# Patient Record
Sex: Female | Born: 1935 | Race: White | Hispanic: No | Smoking: Never smoker
Health system: Southern US, Community
[De-identification: ages and names within clinical notes are randomized; demographics above are authoritative.]

## PROBLEM LIST (undated history)

## (undated) DIAGNOSIS — I10 Essential (primary) hypertension: Secondary | ICD-10-CM

## (undated) DIAGNOSIS — D490 Neoplasm of unspecified behavior of digestive system: Secondary | ICD-10-CM

## (undated) DIAGNOSIS — J449 Chronic obstructive pulmonary disease, unspecified: Secondary | ICD-10-CM

## (undated) DIAGNOSIS — I509 Heart failure, unspecified: Secondary | ICD-10-CM

## (undated) DIAGNOSIS — E7800 Pure hypercholesterolemia, unspecified: Secondary | ICD-10-CM

## (undated) DIAGNOSIS — F0390 Unspecified dementia without behavioral disturbance: Secondary | ICD-10-CM

## (undated) DIAGNOSIS — I219 Acute myocardial infarction, unspecified: Secondary | ICD-10-CM

## (undated) DIAGNOSIS — G459 Transient cerebral ischemic attack, unspecified: Secondary | ICD-10-CM

## (undated) DIAGNOSIS — I5020 Unspecified systolic (congestive) heart failure: Secondary | ICD-10-CM

## (undated) HISTORY — PX: JOINT REPLACEMENT: SHX530

## (undated) HISTORY — PX: CARDIAC CATHETERIZATION: SHX172

## (undated) HISTORY — PX: APPENDECTOMY: SHX54

## (undated) HISTORY — PX: TOTAL KNEE ARTHROPLASTY: SHX125

## (undated) HISTORY — PX: CHOLECYSTECTOMY: SHX55

## (undated) HISTORY — PX: OTHER SURGICAL HISTORY: SHX169

---

## 2015-04-03 ENCOUNTER — Encounter (HOSPITAL_COMMUNITY): Payer: Self-pay | Admitting: *Deleted

## 2015-04-03 ENCOUNTER — Telehealth: Payer: Self-pay | Admitting: Family Medicine

## 2015-04-03 ENCOUNTER — Inpatient Hospital Stay (HOSPITAL_COMMUNITY)
Admission: AD | Admit: 2015-04-03 | Discharge: 2015-04-07 | DRG: 374 | Disposition: A | Discharge status: Hospice | Payer: Medicare Other | Source: Other Acute Inpatient Hospital | Attending: Internal Medicine | Admitting: Internal Medicine

## 2015-04-03 DIAGNOSIS — Z8673 Personal history of transient ischemic attack (TIA), and cerebral infarction without residual deficits: Secondary | ICD-10-CM

## 2015-04-03 DIAGNOSIS — E7800 Pure hypercholesterolemia, unspecified: Secondary | ICD-10-CM | POA: Diagnosis present

## 2015-04-03 DIAGNOSIS — G629 Polyneuropathy, unspecified: Secondary | ICD-10-CM | POA: Diagnosis present

## 2015-04-03 DIAGNOSIS — D62 Acute posthemorrhagic anemia: Secondary | ICD-10-CM | POA: Diagnosis present

## 2015-04-03 DIAGNOSIS — Z800 Family history of malignant neoplasm of digestive organs: Secondary | ICD-10-CM

## 2015-04-03 DIAGNOSIS — G8929 Other chronic pain: Secondary | ICD-10-CM | POA: Diagnosis present

## 2015-04-03 DIAGNOSIS — C787 Secondary malignant neoplasm of liver and intrahepatic bile duct: Secondary | ICD-10-CM | POA: Diagnosis present

## 2015-04-03 DIAGNOSIS — R7989 Other specified abnormal findings of blood chemistry: Secondary | ICD-10-CM | POA: Insufficient documentation

## 2015-04-03 DIAGNOSIS — D500 Iron deficiency anemia secondary to blood loss (chronic): Secondary | ICD-10-CM | POA: Diagnosis present

## 2015-04-03 DIAGNOSIS — C786 Secondary malignant neoplasm of retroperitoneum and peritoneum: Secondary | ICD-10-CM | POA: Diagnosis present

## 2015-04-03 DIAGNOSIS — I2510 Atherosclerotic heart disease of native coronary artery without angina pectoris: Secondary | ICD-10-CM | POA: Diagnosis present

## 2015-04-03 DIAGNOSIS — I482 Chronic atrial fibrillation: Secondary | ICD-10-CM | POA: Diagnosis present

## 2015-04-03 DIAGNOSIS — K922 Gastrointestinal hemorrhage, unspecified: Secondary | ICD-10-CM | POA: Diagnosis not present

## 2015-04-03 DIAGNOSIS — C799 Secondary malignant neoplasm of unspecified site: Secondary | ICD-10-CM | POA: Insufficient documentation

## 2015-04-03 DIAGNOSIS — J449 Chronic obstructive pulmonary disease, unspecified: Secondary | ICD-10-CM | POA: Diagnosis present

## 2015-04-03 DIAGNOSIS — J440 Chronic obstructive pulmonary disease with acute lower respiratory infection: Secondary | ICD-10-CM | POA: Diagnosis present

## 2015-04-03 DIAGNOSIS — J189 Pneumonia, unspecified organism: Secondary | ICD-10-CM | POA: Diagnosis present

## 2015-04-03 DIAGNOSIS — I509 Heart failure, unspecified: Secondary | ICD-10-CM

## 2015-04-03 DIAGNOSIS — K259 Gastric ulcer, unspecified as acute or chronic, without hemorrhage or perforation: Secondary | ICD-10-CM | POA: Diagnosis present

## 2015-04-03 DIAGNOSIS — Z66 Do not resuscitate: Secondary | ICD-10-CM | POA: Diagnosis present

## 2015-04-03 DIAGNOSIS — I5022 Chronic systolic (congestive) heart failure: Secondary | ICD-10-CM | POA: Insufficient documentation

## 2015-04-03 DIAGNOSIS — F0390 Unspecified dementia without behavioral disturbance: Secondary | ICD-10-CM | POA: Diagnosis present

## 2015-04-03 DIAGNOSIS — K921 Melena: Secondary | ICD-10-CM | POA: Diagnosis present

## 2015-04-03 DIAGNOSIS — C169 Malignant neoplasm of stomach, unspecified: Secondary | ICD-10-CM | POA: Diagnosis present

## 2015-04-03 DIAGNOSIS — I5020 Unspecified systolic (congestive) heart failure: Secondary | ICD-10-CM

## 2015-04-03 DIAGNOSIS — I4891 Unspecified atrial fibrillation: Secondary | ICD-10-CM | POA: Diagnosis not present

## 2015-04-03 DIAGNOSIS — D490 Neoplasm of unspecified behavior of digestive system: Secondary | ICD-10-CM | POA: Insufficient documentation

## 2015-04-03 DIAGNOSIS — N184 Chronic kidney disease, stage 4 (severe): Secondary | ICD-10-CM | POA: Diagnosis present

## 2015-04-03 DIAGNOSIS — E785 Hyperlipidemia, unspecified: Secondary | ICD-10-CM | POA: Diagnosis present

## 2015-04-03 DIAGNOSIS — Z515 Encounter for palliative care: Secondary | ICD-10-CM | POA: Diagnosis present

## 2015-04-03 DIAGNOSIS — R945 Abnormal results of liver function studies: Secondary | ICD-10-CM

## 2015-04-03 DIAGNOSIS — I130 Hypertensive heart and chronic kidney disease with heart failure and stage 1 through stage 4 chronic kidney disease, or unspecified chronic kidney disease: Secondary | ICD-10-CM | POA: Diagnosis present

## 2015-04-03 DIAGNOSIS — I252 Old myocardial infarction: Secondary | ICD-10-CM | POA: Diagnosis not present

## 2015-04-03 DIAGNOSIS — C801 Malignant (primary) neoplasm, unspecified: Secondary | ICD-10-CM

## 2015-04-03 DIAGNOSIS — D371 Neoplasm of uncertain behavior of stomach: Secondary | ICD-10-CM | POA: Diagnosis not present

## 2015-04-03 DIAGNOSIS — K3189 Other diseases of stomach and duodenum: Secondary | ICD-10-CM

## 2015-04-03 HISTORY — DX: Pure hypercholesterolemia, unspecified: E78.00

## 2015-04-03 HISTORY — DX: Essential (primary) hypertension: I10

## 2015-04-03 HISTORY — DX: Chronic obstructive pulmonary disease, unspecified: J44.9

## 2015-04-03 HISTORY — DX: Transient cerebral ischemic attack, unspecified: G45.9

## 2015-04-03 HISTORY — DX: Heart failure, unspecified: I50.9

## 2015-04-03 HISTORY — DX: Unspecified systolic (congestive) heart failure: I50.20

## 2015-04-03 HISTORY — DX: Unspecified dementia, unspecified severity, without behavioral disturbance, psychotic disturbance, mood disturbance, and anxiety: F03.90

## 2015-04-03 HISTORY — DX: Acute myocardial infarction, unspecified: I21.9

## 2015-04-03 HISTORY — DX: Neoplasm of unspecified behavior of digestive system: D49.0

## 2015-04-03 MED ORDER — OXYCODONE HCL 5 MG PO TABS: 5.0000 mg | ORAL_TABLET | ORAL | Status: DC | PRN

## 2015-04-03 MED ORDER — ONDANSETRON HCL 4 MG PO TABS: 4.0000 mg | ORAL_TABLET | Freq: Four times a day (QID) | ORAL | Status: DC | PRN

## 2015-04-03 MED ORDER — PANTOPRAZOLE SODIUM 40 MG IV SOLR
INTRAVENOUS | Status: AC
  Filled 2015-04-03: qty 80

## 2015-04-03 MED ORDER — HYDROMORPHONE HCL 1 MG/ML IJ SOLN: 0.5000 mg | INTRAMUSCULAR | Status: DC | PRN

## 2015-04-03 MED ORDER — SODIUM CHLORIDE 0.9 % IV SOLN
8.0000 mg/h | INTRAVENOUS | Status: DC
  Administered 2015-04-04: 8 mg/h via INTRAVENOUS
  Filled 2015-04-03 (×4): qty 80

## 2015-04-03 MED ORDER — SODIUM CHLORIDE 0.9 % IV SOLN
Freq: Once | INTRAVENOUS | Status: AC
  Administered 2015-04-04: 06:00:00 via INTRAVENOUS

## 2015-04-03 MED ORDER — ONDANSETRON HCL 4 MG/2ML IJ SOLN: 4.0000 mg | Freq: Four times a day (QID) | INTRAMUSCULAR | Status: DC | PRN

## 2015-04-03 MED ORDER — PANTOPRAZOLE SODIUM 40 MG IV SOLR: 40.0000 mg | Freq: Two times a day (BID) | INTRAVENOUS | Status: DC

## 2015-04-03 MED ORDER — ACETAMINOPHEN 650 MG RE SUPP: 650.0000 mg | Freq: Four times a day (QID) | RECTAL | Status: DC | PRN

## 2015-04-03 MED ORDER — ACETAMINOPHEN 325 MG PO TABS
650.0000 mg | ORAL_TABLET | Freq: Four times a day (QID) | ORAL | Status: DC | PRN
  Administered 2015-04-05 – 2015-04-07 (×6): 650 mg via ORAL
  Filled 2015-04-03 (×6): qty 2

## 2015-04-03 MED ORDER — SODIUM CHLORIDE 0.9 % IV SOLN
INTRAVENOUS | Status: DC
  Administered 2015-04-03: 1000 mL via INTRAVENOUS
  Administered 2015-04-04 – 2015-04-06 (×4): via INTRAVENOUS

## 2015-04-03 NOTE — H&P (Addendum)
[facility] Admission History and Physical       Mercedes Garcia [id number] DOB: 08-27-1926 DOA: 04/03/2015  Referring physician: EDP PCP: Hubbard Robinson, MD  Specialists:   Chief Complaint: Anemia and Dark Stools  HPI: Mercedes Garcia is a 81 y.o. female with a history of CAD, CHF, COPD, TIAs, Hyperlipidemia, and Dementia who was transferred from [facility] ED to [facility] after being seen for complaints of weakness, malaise, and poor intake of foods and liquids for the past 2-3 days, and passing dark stools for 1 month.   Her family noticed the dark stools but she was on an iron supplement.   She was evaluated at [facility] ED and found to have a hemoglobin of 6.8 and an FOBT was HEME positive.  She was also found to have  A RLL Pneumonia and was placed on IV Antibiotics ( Levaquin).  Arrangements were made for transfer to [facility] since GI is not available there.    She was accepted to a Stepdown Unit Bed, and received a transfusion of 1 unit of packed RBCs while on route.  Her granddaughter is at the bedisd and reports that she had not had any nausea or vomiting or hematemesis.     Review of Systems: Unable to Obtain from the Patient  Constitutional: No Weight Loss, No Weight Gain, Night Sweats, Fevers, Chills, Dizziness, Light Headedness, Fatigue, or Generalized Weakness HEENT: No Headaches, Difficulty Swallowing,Tooth/Dental Problems,Sore Throat,  No Sneezing, Rhinitis, Ear Ache, Nasal Congestion, or Post Nasal Drip,  Cardio-vascular:  No Chest pain, Orthopnea, PND, Edema in Lower Extremities, Anasarca, Dizziness, Palpitations  Resp: No Dyspnea, No DOE, No Productive Cough, No Non-Productive Cough, No Hemoptysis, No Wheezing.    GI: No Heartburn, Indigestion, Abdominal Pain, Nausea, Vomiting, Diarrhea, Constipation, Hematemesis, Hematochezia, Melena, Change in Bowel Habits,  Loss of Appetite  GU: No Dysuria, No Change in Color of  Urine, No Urgency or Urinary Frequency, No Flank pain.  Musculoskeletal: No Joint Pain or Swelling, No Decreased Range of Motion, No Back Pain.  Neurologic: No Syncope, No Seizures, Muscle Weakness, Paresthesia, Vision Disturbance or Loss, No Diplopia, No Vertigo, No Difficulty Walking,  Skin: No Rash or Lesions. Psych: No Change in Mood or Affect, No Depression or Anxiety, No Memory loss, No Confusion, or Hallucinations   Past Medical History  Diagnosis Date  . Myocardial infarction ([place])   . CHF (congestive heart failure) ([place])   . COPD (chronic obstructive pulmonary disease) [facility])      Past Surgical History  Procedure Laterality Date  . Cardiac catheterization    . Joint replacement    . Cholecystectomy    . Total knee arthroplasty        Prior to Admission medications   Medication Sig Start Date End Date Taking? Authorizing Provider  aspirin EC 81 MG tablet Take 81 mg by mouth daily.   Yes Historical Provider, MD  atorvastatin (LIPITOR) 80 MG tablet Take 80 mg by mouth at bedtime.   Yes Historical Provider, MD  B Complex-Biotin-FA (B-COMPLEX PO) Take 1 tablet by mouth daily.   Yes Historical Provider, MD  clopidogrel (PLAVIX) 75 MG tablet Take 75 mg by mouth daily.   Yes Historical Provider, MD  fentaNYL (DURAGESIC - DOSED MCG/HR) 12 MCG/HR APP 1 PATCH TO THE SKIN Q 72 HOURS 03/23/15   Historical Provider, MD  ferrous sulfate 325 (65 FE) MG tablet Take 325 mg by mouth daily.   Yes Historical Provider, MD  furosemide (LASIX)  40 MG tablet Take 40 mg by mouth See admin instructions. Alternates taking 40mg  for two days then takes 60mg  on the third day, then continue   Yes Historical Provider, MD  gabapentin (NEURONTIN) 300 MG capsule Take 300 mg by mouth 3 (three) times daily.   Yes Historical Provider, MD  guaiFENesin (MUCINEX) 600 MG 12 hr tablet Take 600 mg by mouth every 12 (twelve) hours.   Yes Historical Provider, MD  lidocaine (XYLOCAINE) 5 % ointment Apply 1 application  topically daily as needed (for leg pain-neuropathy).  03/15/15  Yes Historical Provider, MD  lisinopril (PRINIVIL,ZESTRIL) 20 MG tablet Take 20 mg by mouth 2 (two) times daily.   Yes Historical Provider, MD  metoprolol tartrate (LOPRESSOR) 25 MG tablet Take 25 mg by mouth 3 (three) times daily.   Yes Historical Provider, MD  nitroGLYCERIN (NITROSTAT) 0.4 MG SL tablet Place 0.4 mg under the tongue every 5 (five) minutes as needed for chest pain.   Yes Historical Provider, MD  potassium chloride (K-DUR,KLOR-CON) 10 MEQ tablet Alternates taking 10MEQ for two days, then takes 15MEQ on the third day, and continue 12/29/14  Yes Historical Provider, MD     Allergies  Allergen Reactions  . Motrin [Ibuprofen] Nausea And Vomiting    Social History:  reports that she has never smoked. She does not have any smokeless tobacco history on file. She reports that she does not drink alcohol or use illicit drugs.     History reviewed. No pertinent family history.     Physical Exam:  GEN:  Pleasant  81 y.o. female examined and in no acute distress; cooperative with exam Filed Vitals:   04/03/15 2120 04/03/15 2145 04/03/15 2200 04/03/15 2203  BP:  95/56 107/56 107/56  Pulse:  101 102 107  Temp: 98.2 F (36.8 C)   98.2 F (36.8 C)  TempSrc: Oral     Resp:  12 19   Height: 5\' 5"  (1.651 m)   5\' 5"  (1.651 m)  Weight: 61.5 kg (135 lb 9.3 oz)   61.5 kg (135 lb 9.3 oz)  SpO2:  98% 99% 98%   Blood pressure 107/56, pulse 107, temperature 98.2 F (36.8 C), temperature source Oral, resp. rate 19, height 5\' 5"  (1.651 m), weight 61.5 kg (135 lb 9.3 oz), SpO2 98 %. PSYCH: She is alert and oriented x 1; does not appear anxious does not appear depressed; affect is normal HEENT: Normocephalic and Atraumatic, Mucous membranes pink; PERRLA; EOM intact; Fundi:  Benign;  No scleral icterus, Nares: Patent, Oropharynx: Clear, Edentulous,    Neck:  FROM, No Cervical Lymphadenopathy nor Thyromegaly or Carotid Bruit; No  JVD; Breasts:: Not examined CHEST WALL: No tenderness CHEST: Normal respiration, clear to auscultation bilaterally HEART: Regular rate and rhythm; no murmurs rubs or gallops BACK: No kyphosis or scoliosis; No CVA tenderness ABDOMEN: Positive Bowel Sounds, Obese, Soft Non-Tender, No Rebound or Guarding; No Masses, No Organomegaly, No Pannus; No Intertriginous candida. Rectal Exam: Not done EXTREMITIES: No Cyanosis, Clubbing, or Edema; No Ulcerations. Genitalia: not examined PULSES: 2+ and symmetric SKIN: Normal hydration no rash or ulceration CNS:  Alert and Oriented x 1, No Focal Deficits Vascular: pulses palpable throughout    Labs on Admission:  Basic Metabolic Panel: No results for input(s): NA, K, CL, CO2, GLUCOSE, BUN, CREATININE, CALCIUM, MG, PHOS in the last 168 hours. Liver Function Tests: No results for input(s): AST, ALT, ALKPHOS, BILITOT, PROT, ALBUMIN in the last 168 hours. No results for input(s): LIPASE, AMYLASE in  the last 168 hours. No results for input(s): AMMONIA in the last 168 hours. CBC: No results for input(s): WBC, NEUTROABS, HGB, HCT, MCV, PLT in the last 168 hours. Cardiac Enzymes: No results for input(s): CKTOTAL, CKMB, CKMBINDEX, TROPONINI in the last 168 hours.  BNP (last 3 results) No results for input(s): BNP in the last 8760 hours.  ProBNP (last 3 results) No results for input(s): PROBNP in the last 8760 hours.  CBG: No results for input(s): GLUCAP in the last 168 hours.  Radiological Exams on Admission: No results found.    Assessment/Plan:   81 y.o. female with  Principal Problem:   1.     Anemia due to chronic blood loss   Transfuse 3 units PRCS   Monitor H/Hs      Active Problems:   2.    GI bleed   GI consult in AM   IV Protonix Drip   IVFs   Hold Plavix and ASA Rx     3.    CAP (community acquired pneumonia)   IV Abxs ( Levaquin) given for CAP   Albuterol Nebs     4.    CHF (congestive heart failure) (HCC)   Monitor  I/Os   Lasix      5.    CAD (coronary artery disease)     6.    COPD (chronic obstructive pulmonary disease) ([place])   Duonebs     7.    Dementia   stable       8.    DVT Prophylaxis   SCDs   Code Status:     FULL CODE        Family Communication:   Family at Bedside  Disposition Plan:    Inpatient Status        Time spent:  [facility] Pager [phone]   If [place] Please Contact the Day Rounding Team MD for [facility]  If 7PM-7AM, Please Contact Night-Floor Coverage  [email or web address] Password [email or web address] 04/03/2015, 11:18 PM     ADDENDUM:   Patient was seen and examined on 04/03/2015

## 2015-04-03 NOTE — Telephone Encounter (Signed)
Saby, Sima 2027/02/28  Transfer from [facility] ED Dr. Hardie Pulley [phone] Reason: no GI coverage at [facility] PMH COPD, CHF, dementia on ASA but no anticoagulation presented with URI. CXR showed RLL pneumonia. Further hx of dark stools. No active bleeding.  Afebrile, 96% on 2L, 110/60, HR 100  WBC 22 Hgb 6.7; MCV 73 BUN 43  Creatinine 2 (baseline 1 on 02/08/2015)  Has been typed and crossed for 2 units and started on abx  Accepted to SDU.   Murray Hodgkins, MD

## 2015-04-04 ENCOUNTER — Encounter (HOSPITAL_COMMUNITY): Payer: Self-pay | Admitting: Gastroenterology

## 2015-04-04 ENCOUNTER — Inpatient Hospital Stay (HOSPITAL_COMMUNITY): Payer: Medicare Other

## 2015-04-04 ENCOUNTER — Encounter (HOSPITAL_COMMUNITY)
Admission: AD | Disposition: A | Discharge status: Hospice | Payer: Self-pay | Source: Other Acute Inpatient Hospital | Attending: Family Medicine

## 2015-04-04 DIAGNOSIS — I509 Heart failure, unspecified: Secondary | ICD-10-CM

## 2015-04-04 DIAGNOSIS — D371 Neoplasm of uncertain behavior of stomach: Secondary | ICD-10-CM

## 2015-04-04 DIAGNOSIS — F0390 Unspecified dementia without behavioral disturbance: Secondary | ICD-10-CM | POA: Diagnosis present

## 2015-04-04 DIAGNOSIS — I4891 Unspecified atrial fibrillation: Secondary | ICD-10-CM

## 2015-04-04 DIAGNOSIS — J449 Chronic obstructive pulmonary disease, unspecified: Secondary | ICD-10-CM | POA: Diagnosis present

## 2015-04-04 DIAGNOSIS — K922 Gastrointestinal hemorrhage, unspecified: Secondary | ICD-10-CM

## 2015-04-04 DIAGNOSIS — D490 Neoplasm of unspecified behavior of digestive system: Secondary | ICD-10-CM | POA: Insufficient documentation

## 2015-04-04 DIAGNOSIS — I2510 Atherosclerotic heart disease of native coronary artery without angina pectoris: Secondary | ICD-10-CM | POA: Diagnosis present

## 2015-04-04 HISTORY — PX: ESOPHAGOGASTRODUODENOSCOPY: SHX5428

## 2015-04-04 LAB — COMPREHENSIVE METABOLIC PANEL
ALK PHOS: 97 U/L (ref 38–126)
ALT: 91 U/L — ABNORMAL HIGH (ref 14–54)
ANION GAP: 9 (ref 5–15)
AST: 96 U/L — ABNORMAL HIGH (ref 15–41)
Albumin: 2.6 g/dL — ABNORMAL LOW (ref 3.5–5.0)
BILIRUBIN TOTAL: 1.4 mg/dL — AB (ref 0.3–1.2)
BUN: 50 mg/dL — ABNORMAL HIGH (ref 6–20)
CALCIUM: 8.8 mg/dL — AB (ref 8.9–10.3)
CO2: 20 mmol/L — ABNORMAL LOW (ref 22–32)
Chloride: 112 mmol/L — ABNORMAL HIGH (ref 101–111)
Creatinine, Ser: 2.01 mg/dL — ABNORMAL HIGH (ref 0.44–1.00)
GFR calc Af Amer: 25 mL/min — ABNORMAL LOW (ref >60)
GFR, EST NON AFRICAN AMERICAN: 21 mL/min — AB (ref >60)
GLUCOSE: 150 mg/dL — AB (ref 65–99)
POTASSIUM: 4 mmol/L (ref 3.5–5.1)
Sodium: 141 mmol/L (ref 135–145)
TOTAL PROTEIN: 5.6 g/dL — AB (ref 6.5–8.1)

## 2015-04-04 LAB — CBC
HEMATOCRIT: 34.7 % — AB (ref 36.0–46.0)
HEMOGLOBIN: 10.9 g/dL — AB (ref 12.0–15.0)
MCH: 24 pg — ABNORMAL LOW (ref 26.0–34.0)
MCHC: 31.4 g/dL (ref 30.0–36.0)
MCV: 76.4 fL — ABNORMAL LOW (ref 78.0–100.0)
Platelets: 307 10*3/uL (ref 150–400)
RBC: 4.54 MIL/uL (ref 3.87–5.11)
RDW: 18.7 % — AB (ref 11.5–15.5)
WBC: 16.9 10*3/uL — AB (ref 4.0–10.5)

## 2015-04-04 LAB — HEMOGLOBIN AND HEMATOCRIT, BLOOD
HEMATOCRIT: 24.1 % — AB (ref 36.0–46.0)
HEMOGLOBIN: 7.4 g/dL — AB (ref 12.0–15.0)

## 2015-04-04 LAB — PREPARE RBC (CROSSMATCH)

## 2015-04-04 LAB — MRSA PCR SCREENING: MRSA BY PCR: NEGATIVE

## 2015-04-04 LAB — ABO/RH: ABO/RH(D): B POS

## 2015-04-04 SURGERY — EGD (ESOPHAGOGASTRODUODENOSCOPY)
Anesthesia: Moderate Sedation

## 2015-04-04 MED ORDER — FENTANYL 12 MCG/HR TD PT72
12.5000 ug | MEDICATED_PATCH | TRANSDERMAL | Status: DC
  Administered 2015-04-04 – 2015-04-07 (×2): 12.5 ug via TRANSDERMAL
  Filled 2015-04-04 (×2): qty 1

## 2015-04-04 MED ORDER — PANTOPRAZOLE SODIUM 40 MG IV SOLR
40.0000 mg | Freq: Two times a day (BID) | INTRAVENOUS | Status: DC
  Administered 2015-04-04 – 2015-04-07 (×6): 40 mg via INTRAVENOUS
  Filled 2015-04-04 (×6): qty 40

## 2015-04-04 MED ORDER — SUCRALFATE 1 GM/10ML PO SUSP
1.0000 g | Freq: Three times a day (TID) | ORAL | Status: DC
  Administered 2015-04-04 – 2015-04-07 (×10): 1 g via ORAL
  Filled 2015-04-04 (×11): qty 10

## 2015-04-04 MED ORDER — ONDANSETRON HCL 4 MG/2ML IJ SOLN
INTRAMUSCULAR | Status: DC | PRN
  Administered 2015-04-04: 4 mg via INTRAVENOUS

## 2015-04-04 MED ORDER — LIDOCAINE VISCOUS 2 % MT SOLN
OROMUCOSAL | Status: AC
Start: 2015-04-04 — End: 2015-04-04
  Filled 2015-04-04: qty 15

## 2015-04-04 MED ORDER — DEXTROSE 5 % IV SOLN
250.0000 mg | INTRAVENOUS | Status: DC
  Administered 2015-04-04 – 2015-04-06 (×3): 250 mg via INTRAVENOUS
  Filled 2015-04-04 (×4): qty 250

## 2015-04-04 MED ORDER — MIDAZOLAM HCL 5 MG/5ML IJ SOLN
INTRAMUSCULAR | Status: AC
  Filled 2015-04-04: qty 10

## 2015-04-04 MED ORDER — MEPERIDINE HCL 100 MG/ML IJ SOLN
INTRAMUSCULAR | Status: AC
  Filled 2015-04-04: qty 2

## 2015-04-04 MED ORDER — LEVOFLOXACIN IN D5W 750 MG/150ML IV SOLN: 750.0000 mg | INTRAVENOUS | Status: DC

## 2015-04-04 MED ORDER — SODIUM CHLORIDE 0.9 % IV SOLN: INTRAVENOUS | Status: DC

## 2015-04-04 MED ORDER — DEXTROSE 5 % IV SOLN
1.0000 g | INTRAVENOUS | Status: DC
  Administered 2015-04-04 – 2015-04-06 (×3): 1 g via INTRAVENOUS
  Filled 2015-04-04 (×4): qty 10

## 2015-04-04 MED ORDER — MEPERIDINE HCL 100 MG/ML IJ SOLN
INTRAMUSCULAR | Status: DC | PRN
  Administered 2015-04-04: 25 mg via INTRAVENOUS

## 2015-04-04 MED ORDER — METOPROLOL TARTRATE 25 MG PO TABS
25.0000 mg | ORAL_TABLET | Freq: Three times a day (TID) | ORAL | Status: DC
  Administered 2015-04-04 – 2015-04-07 (×8): 25 mg via ORAL
  Filled 2015-04-04 (×8): qty 1

## 2015-04-04 MED ORDER — ONDANSETRON HCL 4 MG/2ML IJ SOLN
INTRAMUSCULAR | Status: AC
  Filled 2015-04-04: qty 2

## 2015-04-04 MED ORDER — MIDAZOLAM HCL 5 MG/5ML IJ SOLN
INTRAMUSCULAR | Status: DC | PRN
  Administered 2015-04-04 (×2): 1 mg via INTRAVENOUS

## 2015-04-04 MED ORDER — LIDOCAINE VISCOUS 2 % MT SOLN
OROMUCOSAL | Status: DC | PRN
Start: 2015-04-04 — End: 2015-04-04
  Administered 2015-04-04: 1 via OROMUCOSAL

## 2015-04-04 MED ORDER — FUROSEMIDE 10 MG/ML IJ SOLN
20.0000 mg | Freq: Every day | INTRAMUSCULAR | Status: DC
  Administered 2015-04-04: 20 mg via INTRAVENOUS
  Filled 2015-04-04: qty 2

## 2015-04-04 MED ORDER — AZITHROMYCIN 250 MG PO TABS: 250.0000 mg | ORAL_TABLET | Freq: Every day | ORAL | Status: DC

## 2015-04-04 NOTE — Progress Notes (Deleted)
CRITICAL VALUE ALERT  Critical value received:  K 2.7  Date of notification:  04/04/15  Time of notification:  1105  Critical value read back: Yes  Nurse who received alert: Donneta Romberg, RN  MD notified (1st page):  Dr. Sarajane Jews  Time of first page:  1110  MD notified (2nd page):  Time of second page:  Responding MD:   Time MD responded:

## 2015-04-04 NOTE — Op Note (Signed)
[facility] [address] [place], [address]   ENDOSCOPY PROCEDURE REPORT  PATIENT: Mercedes, Garcia  MR#: [id number] BIRTHDATE: November 18, 1926 , 79  yrs. old GENDER: female ENDOSCOPIST: R.  Garfield Cornea, MD FACP [facility] REFERRED BY:  Dr. Effie [facility]kshire [facility]) PROCEDURE DATE:  04-17-2015 PROCEDURE:  EGD w/ biopsy INDICATIONS:  Melena; decline in hemoglobin; Plavix, aspirin and daily ibuprofen use. MEDICATIONS: Versed 2 mg IV and Demerol 25 mg IV in divided doses. Xylocaine gel orally.  Zofran 4 mg IV. ASA CLASS:      Class III  CONSENT: The risks, benefits, limitations, alternatives and imponderables have been discussed.  The potential for biopsy, esophogeal dilation, etc. have also been reviewed.  Questions have been answered.  All parties agreeable.  Please see the history and physical in the medical record for more information.  DESCRIPTION OF PROCEDURE: After the risks benefits and alternatives of the procedure were thoroughly explained, informed consent was obtained.  The    endoscope was introduced through the mouth and advanced to the second portion of the duodenum , limited by Without limitations. The instrument was slowly withdrawn as the mucosa was fully examined. Estimated blood loss is zero unless otherwise noted in this procedure report.    Normal-appearing tubular esophagus.  Patient had a large, approximately 5 x 6 cm fungating ulcerated mass in the cardia arising out of the gastric mucosa just distal to the GE junction.  There is also a 1.5 cm punched out ulcer on the opposite wall of the cardia which was also suspicious for neoplastic process.  The fungating mass was extremely friable and easily bled when manipulated.  The more distal stomach appeared normal with a patent pylorus.  Examination of the bulb and second portion revealed no abnormalities.  Biopsies of the gastric mass were taken for histologic study. Retroflexed views revealed as  previously described.     The scope was then withdrawn from the patient and the procedure completed.  COMPLICATIONS: There were no immediate complications. EBL 15 mL ENDOSCOPIC IMPRESSION: Normal-appearing tubular esophagus. 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        RECOMMENDATIONS: Twice a day PPI.  Add Carafate suspension 4 times a day.  Allow a clear liquid diet.  Continue to follow H&H.  Follow up on pathology.  REPEAT EXAM:  eSigned:  R. Garfield Cornea, MD Rosalita Chessman [facility] 17-Apr-2015 6:04 PM    CC:  CPT CODES: ICD CODES:  The ICD and CPT codes recommended by this software are interpretations from the data that the clinical staff has captured with the software.  The verification of the translation of this report to the ICD and CPT codes and modifiers is the sole responsibility of the health care institution and practicing physician where this report was generated.  [place]. will not be held responsible for the validity of the ICD and CPT codes included on this report.  AMA assumes no liability for data contained or not contained herein. CPT is a Designer, television/film set of the [employer].  PATIENT NAME:  Mercedes, Garcia MR#: [id number]

## 2015-04-04 NOTE — Progress Notes (Signed)
PROGRESS NOTE  Mercedes Garcia [id number] DOB: Apr 23, 1927 DOA: 04/03/2015 PCP: Hubbard Robinson, MD  Summary: 41 yof with history of CAD, CHF, COPDM TIA, HLD and Dementia was transferred from [facility] ED to [facility]. Patient presented with complaints of weakness, malaise and poor oral intake for 2-3 days and passing dark stools for one month. Evaluation in the [facility] ED revealed a Hgb of 6.8, FOBT was heme positive, and RLL pneumonia. Due to no GI coverage at [facility], she was placed on IV Levaquin and transferred to [facility] and admitted to the SDU. Noted to be transfused 1 Unit PRBC while enroute.   Assessment/Plan: 1. Presumed GI bleed. Etiology unclear.  Plavix and ASA held. Hgb 7.4 s/p 1 unit PRBC.  2. ABLA vs subacute vs chronic blood loss anemia. Secondary to above 3. CAP, RLL pneumonia. Started on IV Levaquin. Minimal hypoxia  4. CHF by history, type unknown, no echo on file. Wil monitor in the setting of IVF hydration. Monitor I/O.  5. CAD. Stable; last stent "years" ago. 6. COPD.  Duonebs as needed.  7. Dementia. Stable. 8. Chronic pain on fentanyl patch.    Overall stable.   Trend CBC, complete transfusion 2 u PRBC, continue PPI  Follow up GI consult.  Check EKG for irregular rhythm  Strict I/O  Code Status: Full DVT prophylaxis: SCDs Family Communication: Granddaughter Tifffany bedside. Discussed with Tiffany and patient. Disposition Plan: Transfer to medical bed. Anticipate discharge within 1-2 days.   Murray Hodgkins, MD  [facility]  Pager [phone] If 7PM-7AM, please contact night-coverage at [email or web address], password [facility] 04/04/2015, 6:29 AM  LOS: 1 day   Consultants:  GI   Procedures:  1 unit PRBC 11/27  Antibiotics:  Levaquin 11/28>>  HPI/Subjective: Ok. Denies pain, naseua, or vomiting. Breathing ok. Lives with daughter and son-in-law. On soft diet for teeth and is on iron supplements. Thought the  dark stools were due to iron intake.   Granddaughter bedside denies past of GI bleeding  Objective: Filed Vitals:   04/04/15 0530 04/04/15 0545 04/04/15 0600 04/04/15 0615  BP: 122/80 126/74 121/63 139/76  Pulse: 87 95 98 53  Temp: 98.4 F (36.9 C)   98.5 F (36.9 C)  TempSrc: Oral   Oral  Resp: 19 22 28 23   Height:      Weight:      SpO2: 98% 99% 99% 94%    Intake/Output Summary (Last 24 hours) at 04/04/15 0629 Last data filed at 04/04/15 0615  Gross per 24 hour  Intake 1458.33 ml  Output      0 ml  Net 1458.33 ml     Filed Weights   04/03/15 2120 04/03/15 2203  Weight: 61.5 kg (135 lb 9.3 oz) 61.5 kg (135 lb 9.3 oz)    Exam:  Afebrile, VSS, mild hypoxia General:  Appears calm and comfortable.   Eyes: PERRL, normal lids, irises & conjunctiva ENT: grossly normal hearing, lips & tongue Cardiovascular: irregular, no m/r/g. No LE edema. Telemetry: irregular Respiratory: CTA bilaterally, no w/r/r. Normal respiratory effort. Abdomen: soft, nt, positive bowel sounds  Skin: BLE with some abrasions Musculoskeletal: grossly normal tone BUE/BLE Psychiatric: mildly confused, speech fluent and appropriate Neurologic: grossly non-focal.  New data reviewed:  Hgb 7.4  Scheduled Meds: . sodium chloride   Intravenous Once  . levofloxacin (LEVAQUIN) IV  750 mg Intravenous Q24H  . [START ON 04/07/2015] pantoprazole (PROTONIX) IV  40 mg Intravenous Q12H   Continuous Infusions: . sodium chloride 1,000  mL (04/03/15 2356)  . pantoprozole (PROTONIX) infusion 8 mg/hr (04/04/15 0028)    Principal Problem:   GI bleed Active Problems:   Anemia due to chronic blood loss   CAP (community acquired pneumonia)   CHF (congestive heart failure) (HCC)   COPD (chronic obstructive pulmonary disease) (HCC)   Dementia   CAD (coronary artery disease)   Time spent 20 minutes    By signing my name below, I, Rennis Harding attest that this documentation has been prepared under the  direction and in the presence of Murray Hodgkins, MD Electronically signed: Rennis Harding  04/04/2015 9:00am   I personally performed the services described in this documentation. All medical record entries made by the scribe were at my direction. I have reviewed the chart and agree that the record reflects my personal performance and is accurate and complete. Murray Hodgkins, MD

## 2015-04-04 NOTE — Consult Note (Signed)
Referring Provider: Dr. Arnoldo Morale  Primary Care Physician:  Hubbard Robinson, MD Primary Gastroenterologist:  Dr. Gala Romney   Date of Admission: 04/03/15 Date of Consultation: 04/04/15  Reason for Consultation:  Melena, acute blood loss anemia, heme positive stool   HPI:  Mercedes Garcia is an 81 y.o. year old female who presented to the [facility] ED yesterday with a Hgb of 6.8 and reports of dark stool for about a month. Evaluated in ED with Hgb of 6.8 and heme positive. Transferred to [facility] due to lack of GI coverage.  Admitting Hgb here 7.4 with 2 units PRBCs received this morning. Appears 1 unit received while en route. Needs post-transfusion H/H. Last episode of melena overnight. Also being treated for pneumonia.   Per granddaughter, Benjie Karvonen (who works in the ED), states patient has had some dark stool but patient and family attributed to iron. Weakness started Friday morning. Has been deteriorating over past several months, lost weight. Son passed away unexpectedly in 07-13-2022, and family felt this may be why she was deteriorating some.  No vomiting. Denies dysphagia. Possible EGD and colonoscopy in remote past. No abdominal pain.   Denies NSAIDs or aspirin powders. On Plavix and baby aspirin.   Past Medical History  Diagnosis Date  . Myocardial infarction ([place])     several, stents placed.   . CHF (congestive heart failure) ([place])   . COPD (chronic obstructive pulmonary disease) ([place])   . Dementia   . Hypertension   . Hypercholesteremia   . TIA (transient ischemic attack)     Past Surgical History  Procedure Laterality Date  . Cardiac catheterization    . Joint replacement    . Cholecystectomy    . Total knee arthroplasty    . Appendectomy    . Hip replamcent    . Right wrist surgery      Prior to Admission medications   Medication Sig Start Date End Date Taking? Authorizing Provider  aspirin EC 81 MG tablet Take 81 mg by mouth daily.   Yes Historical Provider, MD   atorvastatin (LIPITOR) 80 MG tablet Take 80 mg by mouth at bedtime.   Yes Historical Provider, MD  B Complex-Biotin-FA (B-COMPLEX PO) Take 1 tablet by mouth daily.   Yes Historical Provider, MD  clopidogrel (PLAVIX) 75 MG tablet Take 75 mg by mouth daily.   Yes Historical Provider, MD  fentaNYL (DURAGESIC - DOSED MCG/HR) 12 MCG/HR APP 1 PATCH TO THE SKIN Q 72 HOURS 03/23/15   Historical Provider, MD  ferrous sulfate 325 (65 FE) MG tablet Take 325 mg by mouth daily.   Yes Historical Provider, MD  furosemide (LASIX) 40 MG tablet Take 40 mg by mouth See admin instructions. Alternates taking 40mg  for two days then takes 60mg  on the third day, then continue   Yes Historical Provider, MD  gabapentin (NEURONTIN) 300 MG capsule Take 300 mg by mouth 3 (three) times daily.   Yes Historical Provider, MD  guaiFENesin (MUCINEX) 600 MG 12 hr tablet Take 600 mg by mouth every 12 (twelve) hours.   Yes Historical Provider, MD  lidocaine (XYLOCAINE) 5 % ointment Apply 1 application topically daily as needed (for leg pain-neuropathy).  03/15/15  Yes Historical Provider, MD  lisinopril (PRINIVIL,ZESTRIL) 20 MG tablet Take 20 mg by mouth 2 (two) times daily.   Yes Historical Provider, MD  metoprolol tartrate (LOPRESSOR) 25 MG tablet Take 25 mg by mouth 3 (three) times daily.   Yes Historical Provider, MD  nitroGLYCERIN (NITROSTAT) 0.4  MG SL tablet Place 0.4 mg under the tongue every 5 (five) minutes as needed for chest pain.   Yes Historical Provider, MD  potassium chloride (K-DUR,KLOR-CON) 10 MEQ tablet Alternates taking 10MEQ for two days, then takes 15MEQ on the third day, and continue 12/29/14  Yes Historical Provider, MD    Current Facility-Administered Medications  Medication Dose Route Frequency Provider Last Rate Last Dose  . 0.9 %  sodium chloride infusion   Intravenous Continuous Theressa Millard, MD 75 mL/hr at 04/03/15 2356 1,000 mL at 04/03/15 2356  . 0.9 %  sodium chloride infusion   Intravenous Once  Theressa Millard, MD      . acetaminophen (TYLENOL) tablet 650 mg  650 mg Oral Q6H PRN Theressa Millard, MD       Or  . acetaminophen (TYLENOL) suppository 650 mg  650 mg Rectal Q6H PRN Theressa Millard, MD      . HYDROmorphone (DILAUDID) injection 0.5-1 mg  0.5-1 mg Intravenous Q3H PRN Theressa Millard, MD      . levofloxacin (LEVAQUIN) IVPB 750 mg  750 mg Intravenous Q24H Harvette Evonnie Dawes, MD      . ondansetron (ZOFRAN) tablet 4 mg  4 mg Oral Q6H PRN Theressa Millard, MD       Or  . ondansetron (ZOFRAN) injection 4 mg  4 mg Intravenous Q6H PRN Theressa Millard, MD      . oxyCODONE (Oxy IR/ROXICODONE) immediate release tablet 5 mg  5 mg Oral Q4H PRN Theressa Millard, MD      . pantoprazole (PROTONIX) 80 mg in sodium chloride 0.9 % 250 mL (0.32 mg/mL) infusion  8 mg/hr Intravenous Continuous Theressa Millard, MD 25 mL/hr at 04/04/15 0028 8 mg/hr at 04/04/15 0028  . [START ON 04/07/2015] pantoprazole (PROTONIX) injection 40 mg  40 mg Intravenous Q12H Theressa Millard, MD        Allergies as of 04/03/2015 - Review Complete 04/03/2015  Allergen Reaction Noted  . Motrin [ibuprofen] Nausea And Vomiting 04/03/2015    Family History  Problem Relation Age of Onset  . Colon cancer Neg Hx     Social History   Social History  . Marital Status: Widowed    Spouse Name: N/A  . Number of Children: N/A  . Years of Education: N/A   Occupational History  . Not on file.   Social History Main Topics  . Smoking status: Never Smoker   . Smokeless tobacco: Not on file  . Alcohol Use: No  . Drug Use: No  . Sexual Activity: No   Other Topics Concern  . Not on file   Social History Narrative    Review of Systems: Gen: see HPI  CV: Denies chest pain, heart palpitations, syncope, edema  Resp: Denies shortness of breath with rest, cough, wheezing GI: see HPI  GU : Denies urinary burning, urinary frequency, urinary incontinence.  MS: Denies joint pain,swelling,  cramping Derm: Denies rash, itching, dry skin Psych: +confusion Heme: see HPI   Physical Exam: Vital signs in last 24 hours: Temp:  [96.7 F (35.9 C)-98.5 F (36.9 C)] 96.7 F (35.9 C) (11/28 0824) Pulse Rate:  [39-111] 100 (11/28 0700) Resp:  [11-28] 23 (11/28 0700) BP: (95-139)/(51-84) 128/76 mmHg (11/28 0700) SpO2:  [94 %-100 %] 98 % (11/28 0700) FiO2 (%):  [28 %] 28 % (11/27 2320) Weight:  [135 lb 9.3 oz (61.5 kg)] 135 lb 9.3 oz (61.5 kg) (11/27 2203)   General:  Alert, pale, appears stated age Head:  Normocephalic and atraumatic. Eyes:  Sclera clear, no icterus.   Conjunctiva pink. Ears:  Mild hard of hearing Nose:  No deformity, discharge,  or lesions. Mouth:  No deformity or lesions Lungs:  Clear throughout to auscultation. Diminished bases, scattered rhonchi Heart:  S1 S1 present, irregularly irregular  Abdomen:  Soft, mildly distended/round but without tenderness to palpation. No rebound or guarding    Rectal:  Deferred  Msk:  Symmetrical without gross deformities. Normal posture. Extremities:  With 2+ lower extremity pitting edema  Neurologic:  Alert and oriented to person. Confused regarding year, location Psych:  Alert and cooperative. Normal mood and affect.  Intake/Output from previous day: 11/27 0701 - 11/28 0700 In: 1998.3 [P.O.:480; I.V.:1193.3; Blood:325] Out: -  Intake/Output this shift:    Lab Results:  Recent Labs  04/04/15 0128  HGB 7.4*  HCT 24.1*    Impression: 81 year old female admitted with melena, acute blood loss anemia, and heme positive stool, transferred from [place] due to lack of hospital GI coverage. On Plavix and aspirin 81 mg as outpatient but denies any routine NSAIDs/aspirin powders. Appears received 2 units PRBCs here and possibly 1 en route from [facility]. Needs post-transfusion H/H now. No known liver disease. Last episode of melena overnight. Hemodynamically stable for EGD today with Dr. Gala Romney. However, patient was  hesitant to pursue this and has told me she does not want any invasive procedures. With history of dementia, I doubt she completely understands these ramifications. Granddaughter, Steva Colder, spoke with patient, who is now agreeable. Will keep patient NPO for now, continue Protonix drip for now, and anticipate EGD later today.    Plan: H/H now CMP now Remain NPO EGD with Dr. Gala Romney later today Continue Protonix drip: may be able to transition to BID after EGD if appropriate   Orvil Feil, ANP-BC [facility] Gastroenterology      LOS: 1 day    04/04/2015, 9:12 AM

## 2015-04-05 ENCOUNTER — Inpatient Hospital Stay (HOSPITAL_COMMUNITY): Payer: Medicare Other

## 2015-04-05 ENCOUNTER — Encounter (HOSPITAL_COMMUNITY): Payer: Self-pay | Admitting: Family Medicine

## 2015-04-05 DIAGNOSIS — R7989 Other specified abnormal findings of blood chemistry: Secondary | ICD-10-CM

## 2015-04-05 DIAGNOSIS — I5020 Unspecified systolic (congestive) heart failure: Secondary | ICD-10-CM

## 2015-04-05 DIAGNOSIS — R945 Abnormal results of liver function studies: Secondary | ICD-10-CM

## 2015-04-05 DIAGNOSIS — I5022 Chronic systolic (congestive) heart failure: Secondary | ICD-10-CM | POA: Insufficient documentation

## 2015-04-05 HISTORY — DX: Unspecified systolic (congestive) heart failure: I50.20

## 2015-04-05 LAB — COMPREHENSIVE METABOLIC PANEL
ALBUMIN: 2.4 g/dL — AB (ref 3.5–5.0)
ALT: 88 U/L — AB (ref 14–54)
AST: 77 U/L — AB (ref 15–41)
Alkaline Phosphatase: 85 U/L (ref 38–126)
Anion gap: 8 (ref 5–15)
BUN: 51 mg/dL — AB (ref 6–20)
CHLORIDE: 113 mmol/L — AB (ref 101–111)
CO2: 21 mmol/L — AB (ref 22–32)
Calcium: 9 mg/dL (ref 8.9–10.3)
Creatinine, Ser: 1.71 mg/dL — ABNORMAL HIGH (ref 0.44–1.00)
GFR calc Af Amer: 30 mL/min — ABNORMAL LOW (ref >60)
GFR, EST NON AFRICAN AMERICAN: 26 mL/min — AB (ref >60)
GLUCOSE: 113 mg/dL — AB (ref 65–99)
POTASSIUM: 3.9 mmol/L (ref 3.5–5.1)
SODIUM: 142 mmol/L (ref 135–145)
Total Bilirubin: 0.8 mg/dL (ref 0.3–1.2)
Total Protein: 5.2 g/dL — ABNORMAL LOW (ref 6.5–8.1)

## 2015-04-05 LAB — TYPE AND SCREEN
ABO/RH(D): B POS
Antibody Screen: NEGATIVE
UNIT DIVISION: 0
UNIT DIVISION: 0

## 2015-04-05 LAB — CBC
HCT: 34.5 % — ABNORMAL LOW (ref 36.0–46.0)
Hemoglobin: 10.8 g/dL — ABNORMAL LOW (ref 12.0–15.0)
MCH: 23.7 pg — ABNORMAL LOW (ref 26.0–34.0)
MCHC: 31.3 g/dL (ref 30.0–36.0)
MCV: 75.7 fL — ABNORMAL LOW (ref 78.0–100.0)
PLATELETS: 281 10*3/uL (ref 150–400)
RBC: 4.56 MIL/uL (ref 3.87–5.11)
RDW: 18.8 % — AB (ref 11.5–15.5)
WBC: 19.6 10*3/uL — AB (ref 4.0–10.5)

## 2015-04-05 LAB — TSH: TSH: 3.756 u[IU]/mL (ref 0.350–4.500)

## 2015-04-05 MED ORDER — FUROSEMIDE 40 MG PO TABS
40.0000 mg | ORAL_TABLET | Freq: Every day | ORAL | Status: DC
  Administered 2015-04-05 – 2015-04-07 (×3): 40 mg via ORAL
  Filled 2015-04-05 (×3): qty 1

## 2015-04-05 MED ORDER — BARIUM SULFATE 2.1 % PO SUSP
ORAL | Status: AC
  Filled 2015-04-05: qty 2

## 2015-04-05 MED ORDER — POLYETHYLENE GLYCOL 3350 17 G PO PACK
17.0000 g | PACK | Freq: Two times a day (BID) | ORAL | Status: DC
  Administered 2015-04-05 – 2015-04-06 (×3): 17 g via ORAL
  Filled 2015-04-05 (×4): qty 1

## 2015-04-05 NOTE — Progress Notes (Signed)
PROGRESS NOTE  Mercedes Garcia [id number] DOB: 30-May-1926 DOA: 04/03/2015 PCP: Hubbard Robinson, MD  Summary: 54 yof with history of CAD, CHF, COPDM TIA, HLD and Dementia was transferred from [facility] ED to [facility]. Patient presented with complaints of weakness, malaise and poor oral intake for 2-3 days and passing dark stools for one month. Evaluation in the [facility] ED revealed a Hgb of 6.8, FOBT was heme positive, and RLL pneumonia. Due to no GI coverage at [facility], she was placed on IV Levaquin and transferred to [facility] and admitted to the SDU. Noted to be transfused 1 Unit PRBC while enroute.   Assessment/Plan: 1. Upper GI bleed. Secondary to gastric mass and ulcer. No evidence of ongoing bleeding. EGD performed by GI. Carafate added QID and PPI TID. Clear liquid diet. Hgb 10.8 s/p 3 units PRBC. Continue to hold Plavix and ASA. 2. ABLA secondary to above Hgb stable s/p transfusion. 3. CAP, RLL pneumonia.Continues to improve. Wean oxygen. Continue abx.  4. Gastric mass, suspicious for malignancy. Elevated LFTs--plan CT abd/pelvis but will continue hydration first to see if creatinine will allow for IV contrast 5. A-Fib, possibly new diagnosis. She is not a candidate for anticoagulants at this this point. TSH WNL. 6. Systolic dysfunction suspect systolic CHF by history, ECHO results below. Requested old records since patient reports history of  "CHF" this may not be a new finding. Will continue to monitor in the setting of IVF hydration. Monitor I/O.  7. CAD. Stable; last stent "years" ago. Will discuss with GI, timing of Plavix and ASA. 8. AKI vs CKD Stage III-IV, Request old records.  9. COPD.  Duonebs as needed.  10. Dementia. Stable. 11. Chronic pain on fentanyl patch.   Overall stable, Hgb stable as is heart rate.  Suspect creatinine at baseline and BUN high secondary to UGIB. Request old records. Check BMP in the am. SL IV given systolic  dysfunction. No evidence of volume overload  Check BMP in AM. CT abd/pelvis 11/30 +/- contrast depending on renal function  Transfer to medical bed.   Anticipate home next 48 hours  Code Status: Full DVT prophylaxis: SCDs Family Communication:  Disposition Plan: Anticipate discharge   Murray Hodgkins, MD  [facility]  Pager [phone] If 7PM-7AM, please contact night-coverage at [email or web address], password [facility] 04/05/2015, 6:25 AM  LOS: 2 days   Consultants:  GI   Procedures:  1 unit PRBC 11/27  2 unit PRBC 11/28  ECHO: Study Conclusions  - Left ventricle: The cavity size was normal. Wall thickness was at the upper limits of normal. The estimated ejection fraction was 25%. There is dyskinesis of the mid-apicalanteroseptal, anterior, and apical myocardium. There is dyskinesis of the apicalinferior myocardium. The study is not technically sufficient to allow evaluation of LV diastolic function. - Ventricular septum: Septal motion showed abnormal function and dyssynergy. - Aortic valve: Trileaflet; mildly calcified leaflets. - Mitral valve: Mildly calcified leaflets . There was moderate regurgitation. - Left atrium: The atrium was mildly to moderately dilated. - Right atrium: The atrium was moderately dilated. Central venous pressure (est): 15 mm Hg. - Tricuspid valve: There was moderate regurgitation. - Pulmonary arteries: Systolic pressure was severely increased. PA peak pressure: 68 mm Hg (S). - Pericardium, extracardiac: There was no pericardial effusion  Antibiotics:  Levaquin 11/28>>11/28  Rocephin 11/29>>  Zithromax 11/29>>  HPI/Subjective: Slept well. Denies nausea, vomiting, pain or breathing difficulties.   Objective: Filed Vitals:   04/05/15 0300 04/05/15 0400 04/05/15 0500 04/05/15 0600  BP: 116/78 112/59 101/72 119/70  Pulse: 87 87 93 76  Temp:  96.5 F (35.8 C)    TempSrc:  Oral    Resp: 14 10 10 18   Height:        Weight:  64.5 kg (142 lb 3.2 oz)    SpO2: 99% 99% 99% 99%    Intake/Output Summary (Last 24 hours) at 04/05/15 0625 Last data filed at 04/05/15 0600  Gross per 24 hour  Intake 2027.75 ml  Output      0 ml  Net 2027.75 ml     Filed Weights   04/03/15 2120 04/03/15 2203 04/05/15 0400  Weight: 61.5 kg (135 lb 9.3 oz) 61.5 kg (135 lb 9.3 oz) 64.5 kg (142 lb 3.2 oz)    Exam: Afebrile, VSS, on [place]  General:  Sitting up in bed and appears calm and comfortable.  Eyes: PERRL, normal lids, irises & conjunctiva ENT: grossly normal hearing, lips & tongue Cardiovascular: Irregular otherwise unremarkable. Trace pedal edema bilaterally. Telemetry: Afib Respiratory: CTA bilaterally, no w/r/r. Normal respiratory effort. Abdomen: soft, ntnd Musculoskeletal: grossly normal tone BUE/BLE Psychiatric: grossly normal mood and affect, speech fluent and appropriate Neurologic: grossly non-focal.  New data reviewed:  BUN stable, 51  Creatinine deceased, 1.71  WBC increased 19.6  AST/ALT without significant change   T bili is normal   TSH WNL  Hgb 10.8  Scheduled Meds: . azithromycin  250 mg Intravenous Q24H  . cefTRIAXone (ROCEPHIN)  IV  1 g Intravenous Q24H  . fentaNYL  12.5 mcg Transdermal Q72H  . furosemide  20 mg Intravenous Daily  . metoprolol tartrate  25 mg Oral TID  . ondansetron      . pantoprazole (PROTONIX) IV  40 mg Intravenous Q12H  . sucralfate  1 g Oral TID WC & HS   Continuous Infusions: . sodium chloride 75 mL/hr at 04/04/15 1840    Principal Problem:   GI bleed Active Problems:   Anemia due to chronic blood loss   CAP (community acquired pneumonia)   CHF (congestive heart failure) (H[facility])   COPD (chronic obstructive pulmonary disease) ([facility])   Dementia   CAD (coronary artery disease)   Gastric tumor   Time spent 25 minutes    By signing my name below, I, Rennis Harding attest that this documentation has been prepared under the direction and in the  presence of Murray Hodgkins, MD Electronically signed: Rennis Harding  04/05/2015 9:00AM   I personally performed the services described in this documentation. All medical record entries made by the scribe were at my direction. I have reviewed the chart and agree that the record reflects my personal performance and is accurate and complete. Murray Hodgkins, MD

## 2015-04-05 NOTE — Care Management Note (Signed)
Case Management Note  Patient Details  Name: Mercedes Garcia MRN: [id number] Date of Birth: 11-24-1926  Subjective/Objective:                  Pt admitted for GIB and PNA. Pt has dementia, lives with her daughter and requires assistance with ADL's. Per daughter, pt has all DME needed to care for her mom at home. Pt has had Long services in the past through [facility].   Action/Plan: Pt's daughter plans for pt to return home at DC. Pt's daughter unsure at this time if [facility] will be needed. Will cont to follow for DC planning.   Expected Discharge Date:  04/07/15               Expected Discharge Plan:  Home/Self Care  In-House Referral:  NA  Discharge planning Services  CM Consult  Post Acute Care Choice:  NA Choice offered to:  NA  DME Arranged:    DME Agency:     HH Arranged:    HH Agency:     Status of Service:  In process, will continue to follow  Medicare Important Message Given:    Date Medicare IM Given:    Medicare IM give by:    Date Additional Medicare IM Given:    Additional Medicare Important Message give by:     If discussed at [place] of Stay Meetings, dates discussed:    Additional Comments:  Sherald Barge, RN 04/05/2015, 1:38 PM

## 2015-04-05 NOTE — Progress Notes (Signed)
    Subjective: No overt GI bleeding. No N/V. Tolerating diet.   Objective: Vital signs in last 24 hours: Temp:  [96.5 F (35.8 C)-97.1 F (36.2 C)] 96.5 F (35.8 C) (11/29 0400) Pulse Rate:  [38-167] 76 (11/29 0600) Resp:  [9-30] 18 (11/29 0600) BP: (79-135)/(52-90) 119/70 mmHg (11/29 0600) SpO2:  [95 %-100 %] 99 % (11/29 0600) Weight:  [142 lb 3.2 oz (64.5 kg)] 142 lb 3.2 oz (64.5 kg) (11/29 0400) Last BM Date: 04/04/15 General:   Alert and oriented to person Abdomen:  Bowel sounds present, soft, non-tender, non-distended. No HSM or hernias noted. No rebound or guarding. No masses appreciated  Neurologic:  Alert and  oriented to person Psych:  Alert and cooperative. Normal mood and affect.  Intake/Output from previous day: 11/28 0701 - 11/29 0700 In: 1927.8 [P.O.:240; I.V.:1512.8; IV Piggyback:175] Out: -  Intake/Output this shift:    Lab Results:  Recent Labs  04/04/15 0128 04/04/15 1007 04/05/15 0523  WBC  --  16.9* 19.6*  HGB 7.4* 10.9* 10.8*  HCT 24.1* 34.7* 34.5*  PLT  --  307 281   BMET  Recent Labs  04/04/15 1009 04/05/15 0523  NA 141 142  K 4.0 3.9  CL 112* 113*  CO2 20* 21*  GLUCOSE 150* 113*  BUN 50* 51*  CREATININE 2.01* 1.71*  CALCIUM 8.8* 9.0   LFT  Recent Labs  04/04/15 1009 04/05/15 0523  PROT 5.6* 5.2*  ALBUMIN 2.6* 2.4*  AST 96* 77*  ALT 91* 88*  ALKPHOS 97 85  BILITOT 1.4* 0.8    Assessment: 81 year old female admitted with acute blood loss anemia, melena, heme positive stool, with EGD yesterday noting a large fungating ulcerated mass in the cardia just distal to the GE junction, with an ulcer on opposite wall of cardia also concerning for neoplasm. Biopsies completed. No further overt GI bleeding.   Elevated LFTs: unclear etiology but may be secondary to acute illness. No recent imaging on file. Will order CT scan and chest xray for staging purposes. Surgical consultation likely will be needed after imaging completed.    Plan: PPI BID Carafate QID May advance to soft diet as tolerated CT abd/pelvis, CXR Repeat HFP in am   Orvil Feil, ANP-BC [facility] Gastroenterology     LOS: 2 days    04/05/2015, 8:15 AM

## 2015-04-05 NOTE — Plan of Care (Signed)
Problem: Skin Integrity: Goal: Risk for impaired skin integrity will decrease Outcome: Progressing Discussed rash around groin area and possible foley placement for diuresis.

## 2015-04-06 DIAGNOSIS — K921 Melena: Secondary | ICD-10-CM

## 2015-04-06 DIAGNOSIS — C799 Secondary malignant neoplasm of unspecified site: Secondary | ICD-10-CM | POA: Insufficient documentation

## 2015-04-06 DIAGNOSIS — C169 Malignant neoplasm of stomach, unspecified: Secondary | ICD-10-CM | POA: Insufficient documentation

## 2015-04-06 DIAGNOSIS — J189 Pneumonia, unspecified organism: Secondary | ICD-10-CM

## 2015-04-06 DIAGNOSIS — F0390 Unspecified dementia without behavioral disturbance: Secondary | ICD-10-CM

## 2015-04-06 DIAGNOSIS — D500 Iron deficiency anemia secondary to blood loss (chronic): Secondary | ICD-10-CM

## 2015-04-06 DIAGNOSIS — I5022 Chronic systolic (congestive) heart failure: Secondary | ICD-10-CM

## 2015-04-06 LAB — BASIC METABOLIC PANEL
Anion gap: 10 (ref 5–15)
BUN: 51 mg/dL — AB (ref 6–20)
CO2: 20 mmol/L — AB (ref 22–32)
Calcium: 8.7 mg/dL — ABNORMAL LOW (ref 8.9–10.3)
Chloride: 113 mmol/L — ABNORMAL HIGH (ref 101–111)
Creatinine, Ser: 1.54 mg/dL — ABNORMAL HIGH (ref 0.44–1.00)
GFR calc Af Amer: 34 mL/min — ABNORMAL LOW (ref >60)
GFR, EST NON AFRICAN AMERICAN: 29 mL/min — AB (ref >60)
GLUCOSE: 100 mg/dL — AB (ref 65–99)
POTASSIUM: 3.5 mmol/L (ref 3.5–5.1)
Sodium: 143 mmol/L (ref 135–145)

## 2015-04-06 LAB — CBC
HEMATOCRIT: 36.5 % (ref 36.0–46.0)
Hemoglobin: 11.1 g/dL — ABNORMAL LOW (ref 12.0–15.0)
MCH: 23.3 pg — AB (ref 26.0–34.0)
MCHC: 30.4 g/dL (ref 30.0–36.0)
MCV: 76.7 fL — AB (ref 78.0–100.0)
Platelets: 301 10*3/uL (ref 150–400)
RBC: 4.76 MIL/uL (ref 3.87–5.11)
RDW: 19.5 % — AB (ref 11.5–15.5)
WBC: 15.5 10*3/uL — ABNORMAL HIGH (ref 4.0–10.5)

## 2015-04-06 MED ORDER — PHENOL 1.4 % MT LIQD
1.0000 | OROMUCOSAL | Status: DC | PRN
  Filled 2015-04-06: qty 177

## 2015-04-06 MED ORDER — AMOXICILLIN-POT CLAVULANATE 875-125 MG PO TABS
1.0000 | ORAL_TABLET | Freq: Two times a day (BID) | ORAL | Status: DC
  Administered 2015-04-06 – 2015-04-07 (×3): 1 via ORAL
  Filled 2015-04-06 (×8): qty 1

## 2015-04-06 NOTE — Progress Notes (Signed)
Pt alert with confused conversations. Iv patent. Denies any distress. Family at beside. Report given to C.Knight, RN. Pt transferred via bed to room 331 By nursing staff.

## 2015-04-06 NOTE — Progress Notes (Signed)
[facility] PROGRESS NOTE  Mercedes Garcia [id number] DOB: November 01, 1926 DOA: 04/03/2015 PCP: Hubbard Robinson, MD  Assessment/Plan: 1. Upper GI bleed, secondary to gastric mass and ulcer. No evidence of ongoing bleeding, hgb stable s/p 3U PRBCs.  EGD performed by GI 11/28. Will continue Carafate and PPI as per GI. Advance diet as tolerated and continue to hold Plavix and ASA until cleared by GI.  2. ABLA secondary to #1. Hgb stable s/p transfusion of 3U. Continue to follow. 3. CAP, RLL pneumonia. Improved with abx and supplemental O2. Since she has been afebrile and is clinically improving, will transition to oral abx.  She is stable on RA. 4. Gastric adenocarcinoma stage 4, confirmed on biopsy. With patient's multiple medical issues, it does not appear that she would be a good candidate for treatment. Hospice may be the most appropriate approach. Will ask oncology to weigh in.  Abdominal CT findings consistent with extensive liver, mesenteric metastasis. Findings are suspicious for metastasis in the adrenal glands, left ilium, and possible lymphadenopathy in the portal hepatis region. There is heterogeneous mass of the left adnexa and possible mass of the right adnexa. Possible primary include gastric carcinoma or carcinoma of ovarian origin.  5. A-Fib, possibly new diagnosis. She is not a candidate for anticoagulation due to #1. Rate is controlled.   6. Systolic CHF, EF of [id number]. Suspect chronic.  ECHO results below.  Previous ECHO results requested from cardiologist. 7. CAD. Stable; last stent "years" ago. Will discuss with GI, timing of Plavix and ASA. 8. AKI vs CKD Stage III-IV, old records requested.  9. COPD, stable. Duonebs as needed.  10. Dementia. Stable. 11. Chronic pain on fentanyl patch.  Code Status: Full DVT prophylaxis: SCDs Family Communication: No family in room. Discussed care plan with patient. Will call patient's daughter and update her. Disposition Plan: Monitor in  ICU, disposition pending oncology recommendations.    Consultants:  GI  Procedures:  1 unit PRBC 11/27  2 unit PRBC 11/28  ECHO: Study Conclusions  - Left ventricle: The cavity size was normal. Wall thickness was at the upper limits of normal. The estimated ejection fraction was 25%. There is dyskinesis of the mid-apicalanteroseptal, anterior, and apical myocardium. There is dyskinesis of the apicalinferior myocardium. The study is not technically sufficient to allow evaluation of LV diastolic function. - Ventricular septum: Septal motion showed abnormal function and dyssynergy. - Aortic valve: Trileaflet; mildly calcified leaflets. - Mitral valve: Mildly calcified leaflets . There was moderate regurgitation. - Left atrium: The atrium was mildly to moderately dilated. - Right atrium: The atrium was moderately dilated. Central venous pressure (est): 15 mm Hg. - Tricuspid valve: There was moderate regurgitation. - Pulmonary arteries: Systolic pressure was severely increased. PA peak pressure: 68 mm Hg (S). - Pericardium, extracardiac: There was no pericardial effusion  Antibiotics:  Levaquin 11/28>>11/28  Rocephin 11/29>>11/30  Zithromax 11/29>>11/30  Augmentin 11/30>>  HPI/Subjective: Has pain in her bilateral legs from her neuropathy. Breathing is okay. Denies any abdominal pain.   Objective: Filed Vitals:   04/06/15 0200 04/06/15 0400  BP: 125/71   Pulse: 79   Temp:  97 F (36.1 C)  Resp: 13     Intake/Output Summary (Last 24 hours) at 04/06/15 0733 Last data filed at 04/05/15 1000  Gross per 24 hour  Intake    350 ml  Output      0 ml  Net    350 ml   Filed Weights   04/03/15 2203 04/05/15 0400 04/06/15 0500  Weight: 61.5 kg (135 lb 9.3 oz) 64.5 kg (142 lb 3.2 oz) 66.2 kg (145 lb 15.1 oz)    Exam:   General: NAD, looks comfortable  Cardiovascular: irregular  Respiratory: clear bilaterally, No wheezing, rales or  rhonchi  Abdomen: soft, non tender, no distention , bowel sounds normal  Musculoskeletal: No edema b/l  Data Reviewed: Basic Metabolic Panel:  Recent Labs Lab 04/04/15 1009 04/05/15 0523 04/06/15 0425  NA 141 142 143  K 4.0 3.9 3.5  CL 112* 113* 113*  CO2 20* 21* 20*  GLUCOSE 150* 113* 100*  BUN 50* 51* 51*  CREATININE 2.01* 1.71* 1.54*  CALCIUM 8.8* 9.0 8.7*   Liver Function Tests:  Recent Labs Lab 04/04/15 1009 04/05/15 0523  AST 96* 77*  ALT 91* 88*  ALKPHOS 97 85  BILITOT 1.4* 0.8  PROT 5.6* 5.2*  ALBUMIN 2.6* 2.4*    CBC:  Recent Labs Lab 04/04/15 0128 04/04/15 1007 04/05/15 0523 04/06/15 0425  WBC  --  16.9* 19.6* 15.5*  HGB 7.4* 10.9* 10.8* 11.1*  HCT 24.1* 34.7* 34.5* 36.5  MCV  --  76.4* 75.7* 76.7*  PLT  --  307 281 301      Recent Results (from the past 240 hour(s))  MRSA PCR Screening     Status: None   Collection Time: 04/03/15 11:57 PM  Result Value Ref Range Status   MRSA by PCR NEGATIVE NEGATIVE Final    Comment:        The GeneXpert MRSA Assay (FDA approved for NASAL specimens only), is one component of a comprehensive MRSA colonization surveillance program. It is not intended to diagnose MRSA infection nor to guide or monitor treatment for MRSA infections.      Studies: Ct Abdomen Pelvis Wo Contrast  04/05/2015  CLINICAL DATA:  Evaluate gastric mass. Patient refused to drink oral contrast. EXAM: CT ABDOMEN AND PELVIS WITHOUT CONTRAST TECHNIQUE: Multidetector CT imaging of the abdomen and pelvis was performed following the standard protocol without IV contrast. COMPARISON:  None. FINDINGS: Multiple heterogeneous low-density masses are identified in the liver, largest in the right lobe liver measuring 3.7 x 5.4 cm. There is ascites in the abdomen and pelvis. There are extensive soft tissue masses throughout the mesenteries throughout the abdomen and pelvis. There is generalized bowel wall thickening throughout the stomach with  eccentric bowel wall thickening more prominently involving the gastric fundus and proximal body measuring at least 2.9 x 5.9 cm. Small focal nodules are identified in bilateral adrenal glands. The right adrenal gland nodule measures 0.8 cm. The left adrenal gland nodule measures 1.3 cm. There is probable lymphadenopathy along the porta hepatis region. There is no retroperitoneal lymphadenopathy. There is a small calcified granuloma within the spleen. The spleen is otherwise normal. The pancreas is normal. The patient is status post prior cholecystectomy. There is atherosclerosis of the abdominal aorta without aneurysmal dilatation. Images of the pelvis demonstrate heterogeneous mass in the left adnexa measuring 3.9 x 4.6 cm. There is a question mass in the right adnexa with internal calcifications. The uterus is not seen. The bladder is normal. Evaluation of the inferior pelvis is limited due to metallic artifact forearm prior bilateral hip surgeries with hardware. There is a 6 mm focal lytic lucency with cortical destruction of the left ilium best seen on image 58. There is a small right pleural effusion. There is a 5 mm nodule in the anterior right lower lobe on image 5, series 5. IMPRESSION: Findings consistent with extensive  liver, mesenteric metastasis. Findings are suspicious for metastasis in the adrenal glands, left ilium, and possible lymphadenopathy in the portal hepatis region. There is generalized bowel wall thickening throughout the stomach with eccentric bowel wall thickening more prominently involving the gastric fundus and proximal body measuring at least 2.9 x 5.9 cm. There is heterogeneous mass of the left adnexa and possible mass of the right adnexa. Possible primary include gastric carcinoma or carcinoma of ovarian origin. Electronically Signed   By: Abelardo Diesel M.D.   On: 04/05/2015 17:12   Dg Chest 2 View  04/05/2015  CLINICAL DATA:  Weakness and shortness of breath. History of gastric  tumor in pneumonia. EXAM: CHEST  2 VIEW COMPARISON:  Concurrent CT abdomen and pelvis. FINDINGS: The patient is kyphotic in position. There is a superior endplate compression deformity of the T12 vertebral body. There are no priors for comparison to assess for chronicity. A hazy opacity of the medial right lung base corresponds to pleural fluid seen medially at the right lung base on the concurrent abdomen pelvis CT. No evidence of pneumonia or pulmonary edema. IMPRESSION: 1. Cardiomegaly. 2. Small amount of pleural fluid at the medial right lung base, when correlated with the concurrent CT abdomen and pelvis. 3. Superior endplate compression fracture of the T12 vertebral body. Electronically Signed   By: Curlene Dolphin M.D.   On: 04/05/2015 16:58    Scheduled Meds: . azithromycin  250 mg Intravenous Q24H  . cefTRIAXone (ROCEPHIN)  IV  1 g Intravenous Q24H  . fentaNYL  12.5 mcg Transdermal Q72H  . furosemide  40 mg Oral Daily  . metoprolol tartrate  25 mg Oral TID  . pantoprazole (PROTONIX) IV  40 mg Intravenous Q12H  . polyethylene glycol  17 g Oral BID  . sucralfate  1 g Oral TID WC & HS   Continuous Infusions: . sodium chloride 75 mL/hr at 04/06/15 0113    Principal Problem:   GI bleed Active Problems:   Anemia due to chronic blood loss   CAP (community acquired pneumonia)   CHF (congestive heart failure) (HCC)   COPD (chronic obstructive pulmonary disease) (HCC)   Dementia   CAD (coronary artery disease)   Gastric tumor   Elevated LFTs   Systolic CHF ([place])   Chronic systolic congestive heart failure ([place])    Time spent: 25 minutes   Jehanzeb Memon. MD [facility] Pager [phone]. If 7PM-7AM, please contact night-coverage at [email or web address], password [email or web address] 04/06/2015, 7:33 AM  LOS: 3 days      By signing my name below, I, Rosalie Doctor, attest that this documentation has been prepared under the direction and in the presence of [facility]. MD Electronically  Signed: Rosalie Doctor, Scribe. 04/06/2015 10:48am   I, Dr. Kathie Dike, personally performed the services described in this documentaiton. All medical record entries made by the scribe were at my direction and in my presence. I have reviewed the chart and agree that the record reflects my personal performance and is accurate and complete  Kathie Dike, MD, 04/06/2015 11:31 AM

## 2015-04-06 NOTE — Progress Notes (Signed)
Subjective:  No complaints this morning. No nausea. No further melena noted.   Objective: Vital signs in last 24 hours: Temp:  [96.8 F (36 C)-97.5 F (36.4 C)] 97 F (36.1 C) (11/30 0400) Pulse Rate:  [76-101] 79 (11/30 0200) Resp:  [12-16] 13 (11/30 0200) BP: (91-125)/(66-75) 125/71 mmHg (11/30 0200) SpO2:  [93 %-99 %] 94 % (11/30 0200) Weight:  [145 lb 15.1 oz (66.2 kg)] 145 lb 15.1 oz (66.2 kg) (11/30 0500) Last BM Date: 04/01/15 General:   Alert,  Elderly female, pleasant and cooperative in NAD Head:  Normocephalic and atraumatic. Eyes:  Sclera clear, no icterus.  Abdomen:  Soft, nontender and nondistended. Extremities:  Without clubbing, deformity or edema. Neurologic:  Alert   grossly normal neurologically. Skin:  Intact without significant lesions or rashes. Psych:  Alert and cooperative. Normal mood and affect.  Intake/Output from previous day: 11/29 0701 - 11/30 0700 In: 350 [I.V.:300; IV Piggyback:50] Out: -  Intake/Output this shift:    Lab Results: CBC  Recent Labs  04/04/15 1007 04/05/15 0523 04/06/15 0425  WBC 16.9* 19.6* 15.5*  HGB 10.9* 10.8* 11.1*  HCT 34.7* 34.5* 36.5  MCV 76.4* 75.7* 76.7*  PLT 307 281 301   BMET  Recent Labs  04/04/15 1009 04/05/15 0523 04/06/15 0425  NA 141 142 143  K 4.0 3.9 3.5  CL 112* 113* 113*  CO2 20* 21* 20*  GLUCOSE 150* 113* 100*  BUN 50* 51* 51*  CREATININE 2.01* 1.71* 1.54*  CALCIUM 8.8* 9.0 8.7*   LFTs  Recent Labs  04/04/15 1009 04/05/15 0523  BILITOT 1.4* 0.8  ALKPHOS 97 85  AST 96* 77*  ALT 91* 88*  PROT 5.6* 5.2*  ALBUMIN 2.6* 2.4*   No results for input(s): LIPASE in the last 72 hours. PT/INR No results for input(s): LABPROT, INR in the last 72 hours.    Imaging Studies: Ct Abdomen Pelvis Wo Contrast  04/05/2015  CLINICAL DATA:  Evaluate gastric mass. Patient refused to drink oral contrast. EXAM: CT ABDOMEN AND PELVIS WITHOUT CONTRAST TECHNIQUE: Multidetector CT imaging of the  abdomen and pelvis was performed following the standard protocol without IV contrast. COMPARISON:  None. FINDINGS: Multiple heterogeneous low-density masses are identified in the liver, largest in the right lobe liver measuring 3.7 x 5.4 cm. There is ascites in the abdomen and pelvis. There are extensive soft tissue masses throughout the mesenteries throughout the abdomen and pelvis. There is generalized bowel wall thickening throughout the stomach with eccentric bowel wall thickening more prominently involving the gastric fundus and proximal body measuring at least 2.9 x 5.9 cm. Small focal nodules are identified in bilateral adrenal glands. The right adrenal gland nodule measures 0.8 cm. The left adrenal gland nodule measures 1.3 cm. There is probable lymphadenopathy along the porta hepatis region. There is no retroperitoneal lymphadenopathy. There is a small calcified granuloma within the spleen. The spleen is otherwise normal. The pancreas is normal. The patient is status post prior cholecystectomy. There is atherosclerosis of the abdominal aorta without aneurysmal dilatation. Images of the pelvis demonstrate heterogeneous mass in the left adnexa measuring 3.9 x 4.6 cm. There is a question mass in the right adnexa with internal calcifications. The uterus is not seen. The bladder is normal. Evaluation of the inferior pelvis is limited due to metallic artifact forearm prior bilateral hip surgeries with hardware. There is a 6 mm focal lytic lucency with cortical destruction of the left ilium best seen on image 58. There is a small  right pleural effusion. There is a 5 mm nodule in the anterior right lower lobe on image 5, series 5. IMPRESSION: Findings consistent with extensive liver, mesenteric metastasis. Findings are suspicious for metastasis in the adrenal glands, left ilium, and possible lymphadenopathy in the portal hepatis region. There is generalized bowel wall thickening throughout the stomach with  eccentric bowel wall thickening more prominently involving the gastric fundus and proximal body measuring at least 2.9 x 5.9 cm. There is heterogeneous mass of the left adnexa and possible mass of the right adnexa. Possible primary include gastric carcinoma or carcinoma of ovarian origin. Electronically Signed   By: Abelardo Diesel M.D.   On: 04/05/2015 17:12   Dg Chest 2 View  04/05/2015  CLINICAL DATA:  Weakness and shortness of breath. History of gastric tumor in pneumonia. EXAM: CHEST  2 VIEW COMPARISON:  Concurrent CT abdomen and pelvis. FINDINGS: The patient is kyphotic in position. There is a superior endplate compression deformity of the T12 vertebral body. There are no priors for comparison to assess for chronicity. A hazy opacity of the medial right lung base corresponds to pleural fluid seen medially at the right lung base on the concurrent abdomen pelvis CT. No evidence of pneumonia or pulmonary edema. IMPRESSION: 1. Cardiomegaly. 2. Small amount of pleural fluid at the medial right lung base, when correlated with the concurrent CT abdomen and pelvis. 3. Superior endplate compression fracture of the T12 vertebral body. Electronically Signed   By: Curlene Dolphin M.D.   On: 04/05/2015 16:58  [2 weeks]   Assessment: 81 year old female admitted with acute blood loss anemia, melena, heme positive stool, with EGD showing a large fungating ulcerated mass in the cardia just distal to the GE junction, with an ulcer on opposite wall of cardia also concerning for neoplasm. Biopsies - gastric adenocarcinoma. No further overt GI bleeding.   CT shows extensive liver, mesenteric metastasis and possibly mets in adrenal glands.    Plan: 1. Discussed with Dr. Gala Romney and Dr. Roderic Palau. Patient is likely not a candidate for any type of treatment but would like oncology to weigh in on this matter.   Laureen Ochs. Bernarda Caffey [facility] [phone] 11/30/20169:39 AM     LOS: 3 days

## 2015-04-06 NOTE — Consult Note (Signed)
Inpatient Hematology/Oncology Consultation   Name: Mercedes Garcia      MRN: [id number]    Location: A331/A331-01  Date: 04/06/2015 Time:5:53 PM   REFERRING PHYSICIAN:  Dr. Roderic Palau  REASON FOR CONSULT:  Gastric Cancer, Stage IV   DIAGNOSIS:  Stage IV gastric cancer Poor PS Dementia  HISTORY OF PRESENT ILLNESS:   81 year old female who resides with her daughter and son in law just diagnosed with stage IV gastric cancer. Patient has dementia and has lived with her daughter for 14 years.  Daughter notes that last year her mother fell and broke her hip, since then she has become more dependent on her for routine self care. Starting before Thanksgiving patient became non ambulatory, previously only using a walker intermittently.  She has a known history of CAD, CHF (EF 30-35%), COPD, TIA and dementia. She was transferred from [facility] ED to [facility] after presenting with weakness. HGB was 6.8, positive FOBT.  She had an EGD that showed a large fungated ulcerated mass in the cardia just distal to the GE juntion, ulcer was also noted on the opposite wall of the cardia also concerning for neoplasm.   CT abd/pelvis showed findings c/w extensive liver, mesenteric metastasis.   Oncology asked to see patient and family regarding options.    PAST MEDICAL HISTORY:   Past Medical History  Diagnosis Date  . Myocardial infarction ([place])     several, stents placed.   . CHF (congestive heart failure) ([place])   . COPD (chronic obstructive pulmonary disease) ([place])   . Dementia   . Hypertension   . Hypercholesteremia   . TIA (transient ischemic attack)   . Systolic CHF ([place]) 53/74/8270  . Gastric tumor     ALLERGIES: Allergies  Allergen Reactions  . Motrin [Ibuprofen] Nausea And Vomiting      MEDICATIONS: I have reviewed the patient's current medications.     PAST SURGICAL HISTORY Past Surgical History  Procedure Laterality Date  . Cardiac  catheterization    . Joint replacement    . Cholecystectomy    . Total knee arthroplasty    . Appendectomy    . Hip replamcent    . Right wrist surgery      FAMILY HISTORY: Family History  Problem Relation Age of Onset  . Colon cancer Neg Hx   Patient's son died from [facility] at 21, Father died from colon cancer in 32's.    SOCIAL HISTORY:  reports that she has never smoked. She does not have any smokeless tobacco history on file. She reports that she does not drink alcohol or use illicit drugs. 5 living children. Resides with daughter and son in law.  PERFORMANCE STATUS: The patient's performance status is 3 - Symptomatic, >50% confined to bed  Review of Systems  Constitutional: Positive for weight loss and malaise/fatigue. Negative for fever and chills.  HENT: Negative for congestion, nosebleeds and sore throat.   Eyes: Negative.   Respiratory: Negative for cough, hemoptysis, sputum production, shortness of breath and wheezing.   Cardiovascular: Negative for chest pain.  Gastrointestinal: Positive for abdominal pain, blood in stool and melena. Negative for heartburn, nausea and vomiting.  Genitourinary: Negative.   Musculoskeletal:       Leg pain  Skin: Negative.   Neurological: Positive for weakness. Negative for dizziness and headaches.       Dementia, poor short term memory  Endo/Heme/Allergies: Bruises/bleeds easily.  Psychiatric/Behavioral: Negative.     PHYSICAL  EXAMINATION  ECOG PERFORMANCE STATUS: 3 - Symptomatic, >50% confined to bed  Filed Vitals:   04/06/15 1400 04/06/15 1730  BP: 104/70 119/61  Pulse:  74  Temp:    Resp: 13     Physical Exam  Constitutional: No distress.  Lying in hospital bed, cooperative  HENT:  Head: Normocephalic and atraumatic.  Mouth/Throat: Oropharynx is clear and moist. No oropharyngeal exudate.  Eyes: Pupils are equal, round, and reactive to light. Right eye exhibits no discharge. Left eye exhibits no discharge.  Neck:  Normal range of motion. No tracheal deviation present. No thyromegaly present.  Cardiovascular: Normal rate and normal heart sounds.  Exam reveals no friction rub.   Pulmonary/Chest: Effort normal and breath sounds normal. No stridor. No respiratory distress. She has no wheezes. She has no rales.  Abdominal: Soft. Bowel sounds are normal. She exhibits no distension. There is no tenderness. There is no guarding.  Musculoskeletal: She exhibits edema. She exhibits no tenderness.  Lymphadenopathy:    She has no cervical adenopathy.  Neurological: She is alert. No cranial nerve deficit.  Skin: Skin is warm and dry. She is not diaphoretic.  Psychiatric: Mood and affect normal.    LABORATORY DATA:  Results for orders placed or performed during the hospital encounter of 04/03/15 (from the past 48 hour(s))  CBC     Status: Abnormal   Collection Time: 04/05/15  5:23 AM  Result Value Ref Range   WBC 19.6 (H) 4.0 - 10.5 K/uL   RBC 4.56 3.87 - 5.11 MIL/uL   Hemoglobin 10.8 (L) 12.0 - 15.0 g/dL   HCT 34.5 (L) 36.0 - 46.0 %   MCV 75.7 (L) 78.0 - 100.0 fL   MCH 23.7 (L) 26.0 - 34.0 pg   MCHC 31.3 30.0 - 36.0 g/dL   RDW 18.8 (H) 11.5 - 15.5 %   Platelets 281 150 - 400 K/uL  Comprehensive metabolic panel     Status: Abnormal   Collection Time: 04/05/15  5:23 AM  Result Value Ref Range   Sodium 142 135 - 145 mmol/L   Potassium 3.9 3.5 - 5.1 mmol/L   Chloride 113 (H) 101 - 111 mmol/L   CO2 21 (L) 22 - 32 mmol/L   Glucose, Bld 113 (H) 65 - 99 mg/dL   BUN 51 (H) 6 - 20 mg/dL   Creatinine, Ser 1.71 (H) 0.44 - 1.00 mg/dL   Calcium 9.0 8.9 - 10.3 mg/dL   Total Protein 5.2 (L) 6.5 - 8.1 g/dL   Albumin 2.4 (L) 3.5 - 5.0 g/dL   AST 77 (H) 15 - 41 U/L   ALT 88 (H) 14 - 54 U/L   Alkaline Phosphatase 85 38 - 126 U/L   Total Bilirubin 0.8 0.3 - 1.2 mg/dL   GFR calc non Af Amer 26 (L) >60 mL/min   GFR calc Af Amer 30 (L) >60 mL/min    Comment: (NOTE) The eGFR has been calculated using the CKD EPI  equation. This calculation has not been validated in all clinical situations. eGFR's persistently <60 mL/min signify possible Chronic Kidney Disease.    Anion gap 8 5 - 15  TSH     Status: None   Collection Time: 04/05/15  5:23 AM  Result Value Ref Range   TSH 3.756 0.350 - 4.500 uIU/mL  Basic metabolic panel     Status: Abnormal   Collection Time: 04/06/15  4:25 AM  Result Value Ref Range   Sodium 143 135 - 145 mmol/L  Potassium 3.5 3.5 - 5.1 mmol/L   Chloride 113 (H) 101 - 111 mmol/L   CO2 20 (L) 22 - 32 mmol/L   Glucose, Bld 100 (H) 65 - 99 mg/dL   BUN 51 (H) 6 - 20 mg/dL   Creatinine, Ser 1.54 (H) 0.44 - 1.00 mg/dL   Calcium 8.7 (L) 8.9 - 10.3 mg/dL   GFR calc non Af Amer 29 (L) >60 mL/min   GFR calc Af Amer 34 (L) >60 mL/min    Comment: (NOTE) The eGFR has been calculated using the CKD EPI equation. This calculation has not been validated in all clinical situations. eGFR's persistently <60 mL/min signify possible Chronic Kidney Disease.    Anion gap 10 5 - 15  CBC     Status: Abnormal   Collection Time: 04/06/15  4:25 AM  Result Value Ref Range   WBC 15.5 (H) 4.0 - 10.5 K/uL   RBC 4.76 3.87 - 5.11 MIL/uL   Hemoglobin 11.1 (L) 12.0 - 15.0 g/dL   HCT 36.5 36.0 - 46.0 %   MCV 76.7 (L) 78.0 - 100.0 fL   MCH 23.3 (L) 26.0 - 34.0 pg   MCHC 30.4 30.0 - 36.0 g/dL   RDW 19.5 (H) 11.5 - 15.5 %   Platelets 301 150 - 400 K/uL      RADIOGRAPHY: Ct Abdomen Pelvis Wo Contrast  04/05/2015  CLINICAL DATA:  Evaluate gastric mass. Patient refused to drink oral contrast. EXAM: CT ABDOMEN AND PELVIS WITHOUT CONTRAST TECHNIQUE: Multidetector CT imaging of the abdomen and pelvis was performed following the standard protocol without IV contrast. COMPARISON:  None. FINDINGS: Multiple heterogeneous low-density masses are identified in the liver, largest in the right lobe liver measuring 3.7 x 5.4 cm. There is ascites in the abdomen and pelvis. There are extensive soft tissue masses  throughout the mesenteries throughout the abdomen and pelvis. There is generalized bowel wall thickening throughout the stomach with eccentric bowel wall thickening more prominently involving the gastric fundus and proximal body measuring at least 2.9 x 5.9 cm. Small focal nodules are identified in bilateral adrenal glands. The right adrenal gland nodule measures 0.8 cm. The left adrenal gland nodule measures 1.3 cm. There is probable lymphadenopathy along the porta hepatis region. There is no retroperitoneal lymphadenopathy. There is a small calcified granuloma within the spleen. The spleen is otherwise normal. The pancreas is normal. The patient is status post prior cholecystectomy. There is atherosclerosis of the abdominal aorta without aneurysmal dilatation. Images of the pelvis demonstrate heterogeneous mass in the left adnexa measuring 3.9 x 4.6 cm. There is a question mass in the right adnexa with internal calcifications. The uterus is not seen. The bladder is normal. Evaluation of the inferior pelvis is limited due to metallic artifact forearm prior bilateral hip surgeries with hardware. There is a 6 mm focal lytic lucency with cortical destruction of the left ilium best seen on image 58. There is a small right pleural effusion. There is a 5 mm nodule in the anterior right lower lobe on image 5, series 5. IMPRESSION: Findings consistent with extensive liver, mesenteric metastasis. Findings are suspicious for metastasis in the adrenal glands, left ilium, and possible lymphadenopathy in the portal hepatis region. There is generalized bowel wall thickening throughout the stomach with eccentric bowel wall thickening more prominently involving the gastric fundus and proximal body measuring at least 2.9 x 5.9 cm. There is heterogeneous mass of the left adnexa and possible mass of the right adnexa. Possible primary include  gastric carcinoma or carcinoma of ovarian origin. Electronically Signed   By: Abelardo Diesel  M.D.   On: 04/05/2015 17:12   Dg Chest 2 View  04/05/2015  CLINICAL DATA:  Weakness and shortness of breath. History of gastric tumor in pneumonia. EXAM: CHEST  2 VIEW COMPARISON:  Concurrent CT abdomen and pelvis. FINDINGS: The patient is kyphotic in position. There is a superior endplate compression deformity of the T12 vertebral body. There are no priors for comparison to assess for chronicity. A hazy opacity of the medial right lung base corresponds to pleural fluid seen medially at the right lung base on the concurrent abdomen pelvis CT. No evidence of pneumonia or pulmonary edema. IMPRESSION: 1. Cardiomegaly. 2. Small amount of pleural fluid at the medial right lung base, when correlated with the concurrent CT abdomen and pelvis. 3. Superior endplate compression fracture of the T12 vertebral body. Electronically Signed   By: Curlene Dolphin M.D.   On: 04/05/2015 16:58       PATHOLOGY:     ASSESSMENT:  Stage IV gastric cancer with liver metastases Dementia Patient unable to live independently Poor PS GI Bleed CHF  PLAN:   Spent over 90 minutes in patient care and chart review. Discussed with family goals of care. They are Very Realistic. Request was made to not tell patient of diagnosis, after meeting patient given her poor insight I feel this is reasonable. Her daughter is her principal caregiver. Patient depends upon her for most of her ADL's.   Family have chosen comfort care. This is most appropriate. Changed code status to DNR.  Discussed with Dr. Roderic Palau. Will consult case management for hospice options for patient as she resides in [place] with her daughter.  All questions were answered. This note was electronically signed. Molli Hazard MD

## 2015-04-07 ENCOUNTER — Encounter (HOSPITAL_COMMUNITY): Payer: Self-pay | Admitting: Internal Medicine

## 2015-04-07 LAB — BASIC METABOLIC PANEL
Anion gap: 8 (ref 5–15)
BUN: 42 mg/dL — AB (ref 6–20)
CALCIUM: 8.7 mg/dL — AB (ref 8.9–10.3)
CO2: 22 mmol/L (ref 22–32)
CREATININE: 1.2 mg/dL — AB (ref 0.44–1.00)
Chloride: 114 mmol/L — ABNORMAL HIGH (ref 101–111)
GFR calc Af Amer: 46 mL/min — ABNORMAL LOW (ref >60)
GFR, EST NON AFRICAN AMERICAN: 40 mL/min — AB (ref >60)
GLUCOSE: 101 mg/dL — AB (ref 65–99)
Potassium: 3.3 mmol/L — ABNORMAL LOW (ref 3.5–5.1)
Sodium: 144 mmol/L (ref 135–145)

## 2015-04-07 LAB — CBC
HEMATOCRIT: 40.2 % (ref 36.0–46.0)
Hemoglobin: 12.3 g/dL (ref 12.0–15.0)
MCH: 23.3 pg — ABNORMAL LOW (ref 26.0–34.0)
MCHC: 30.6 g/dL (ref 30.0–36.0)
MCV: 76.1 fL — ABNORMAL LOW (ref 78.0–100.0)
PLATELETS: 285 10*3/uL (ref 150–400)
RBC: 5.28 MIL/uL — ABNORMAL HIGH (ref 3.87–5.11)
RDW: 20 % — AB (ref 11.5–15.5)
WBC: 20.1 10*3/uL — ABNORMAL HIGH (ref 4.0–10.5)

## 2015-04-07 MED ORDER — SUCRALFATE 1 GM/10ML PO SUSP: 1.0000 g | Freq: Three times a day (TID) | ORAL | Status: AC

## 2015-04-07 MED ORDER — MORPHINE SULFATE (CONCENTRATE) 10 MG /0.5 ML PO SOLN: 10.0000 mg | ORAL | Status: AC | PRN

## 2015-04-07 MED ORDER — POLYETHYLENE GLYCOL 3350 17 G PO PACK: 17.0000 g | PACK | Freq: Every day | ORAL | Status: AC | PRN

## 2015-04-07 MED ORDER — FUROSEMIDE 40 MG PO TABS: 40.0000 mg | ORAL_TABLET | Freq: Every day | ORAL | Status: AC | PRN

## 2015-04-07 MED ORDER — PANTOPRAZOLE SODIUM 40 MG PO TBEC: 40.0000 mg | DELAYED_RELEASE_TABLET | Freq: Two times a day (BID) | ORAL | Status: AC

## 2015-04-07 NOTE — Discharge Summary (Signed)
Physician Discharge Summary  Mercedes Garcia [id number] DOB: 17-Apr-1927 DOA: 04/03/2015  PCP: Hubbard Robinson, MD  Admit date: 04/03/2015 Discharge date: 04/07/2015  Time spent: 35 minutes  Recommendations for Outpatient Follow-up:  Patient will be discharged home with [facility] for end of life care.     Discharge Diagnoses:  Principal Problem:   GI bleed Active Problems:   Anemia due to chronic blood loss   CAP (community acquired pneumonia)   CHF (congestive heart failure) (HCC)   COPD (chronic obstructive pulmonary disease) (HCC)   Dementia   CAD (coronary artery disease)   Gastric tumor   Elevated LFTs   Systolic CHF (HCC)   Chronic systolic congestive heart failure (HCC)   Gastric adenocarcinoma (HCC)   Metastatic adenocarcinoma ([place])   Discharge Condition: Stable  Diet recommendation: Heart Healthy  Filed Weights   04/03/15 2203 04/05/15 0400 04/06/15 0500  Weight: 61.5 kg (135 lb 9.3 oz) 64.5 kg (142 lb 3.2 oz) 66.2 kg (145 lb 15.1 oz)    History of present illness:  81 year old female with a hx of CAD, CHF, COPD, TIA, HLD and Dementia was transferred from [facility] ED to [facility]. Patient presented with complaints of weakness, malaise and poor oral intake for 2-3 days and passing dark stools for one month. Evaluation in the [facility] ED revealed a Hgb of 6.8, FOBT was heme positive, and RLL pneumonia. Due to no GI coverage at [facility], she was placed on IV Levaquin and transferred to [facility] and admitted to the SDU. Noted to be transfused 1 Unit PRBC while enroute.   Hospital Course:  Patient presented with an upper GI bleed. GI followed and performed an EGD on 11/28 which revealed a gastric mass and ulcer. Initial Hgb was 6.8 however it has remained stable s/p transfusion of 3U of PRBCs. She was placed on Carafate and PPI with resolution of her symptoms. She is now tolerating a normal diet without complications and has had  no further bleeding.   Gastric adenocarcinoma stage 4.  Abdominal CT findings consistent with extensive liver, mesenteric metastasis. Findings are suspicious for metastasis in the adrenal glands, left ilium, and possible lymphadenopathy in the portal hepatic region. There is a heterogeneous mass of the left adnexa and possible mass of the right adnexa. Biopsy confirmed adenocarcinoma. With patient's multiple medical issues, it does not appear that she would be a good candidate for treatment. Oncology was consulted and spoke with the patient and family. Due to patient's dementia and poor insight, at daughters request, the patient will not be informed of this diagnosis. It was decided that comfort care would be the most appropriate/beneficial approach for the patient and her code status would be changed to DNR. She will return to her daughter's house with plan for [place] care.  1. ABLA secondary to #1. Hgb stable s/p transfusion of 3U PRBCs.  2. CAP, RLL pneumonia. Improved with abx and supplemental O2. She is now breathing comfortably on RA with stable vitals.  Abx were completed in the hospital.  3. Chronic A-Fib. She is not a candidate for anticoagulation due to her recent GI bleed. Rate is controlled. Patient was on Plavix and ASA on admission however given her recent GI bleed and the decision to make her comfort measures only, it will be discontinued. 4. Chronic systolic CHF, EF of 123456. This is a decline from prior EF of 40% per her primary cardiologist. Patient has been transitioned to comfort measures and  will use Lasix PRN.  ECHO results below.  5. CAD. Stable; last stent "years" ago.   6. 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  7. COPD, stable. Continue home management.  8. Dementia. Stable. 9. Chronic pain on fentanyl patch.  Consultants:  GI  Oncology  Procedures:  1 unit PRBC 11/27  2 unit PRBC 11/28  ECHO: Study Conclusions  - Left ventricle: The cavity size was  normal. Wall thickness was at the upper limits of normal. The estimated ejection fraction was 25%. There is dyskinesis of the mid-apicalanteroseptal, anterior, and apical myocardium. There is dyskinesis of the apicalinferior myocardium. The study is not technically sufficient to allow evaluation of LV diastolic function. - Ventricular septum: Septal motion showed abnormal function and dyssynergy. - Aortic valve: Trileaflet; mildly calcified leaflets. - Mitral valve: Mildly calcified leaflets . There was moderate regurgitation. - Left atrium: The atrium was mildly to moderately dilated. - Right atrium: The atrium was moderately dilated. Central venous pressure (est): 15 mm Hg. - Tricuspid valve: There was moderate regurgitation. - Pulmonary arteries: Systolic pressure was severely increased. PA peak pressure: 68 mm Hg (S). - Pericardium, extracardiac: There was no pericardial effusion  Discharge Exam: Filed Vitals:   04/06/15 2108 04/07/15 0537  BP: 113/70 138/83  Pulse: 84 89  Temp: 97.5 F (36.4 C) 98 F (36.7 C)  Resp: 18 18     General: NAD, looks comfortable  Cardiovascular: irregularly irregular  Respiratory: clear bilaterally, No wheezing, rales or rhonchi  Abdomen: soft, non tender, no distention , bowel sounds normal  Musculoskeletal:1+ BLE edema  Discharge Instructions   Discharge Instructions    Diet - low sodium heart healthy    Complete by:  As directed      Increase activity slowly    Complete by:  As directed           Current Discharge Medication List    START taking these medications   Details  Morphine Sulfate (MORPHINE CONCENTRATE) 10 mg / 0.5 ml concentrated solution Take 0.5 mLs (10 mg total) by mouth every 2 (two) hours as needed for severe pain. Qty: 120 mL, Refills: 0    pantoprazole (PROTONIX) 40 MG tablet Take 1 tablet (40 mg total) by mouth 2 (two) times daily. Qty: 60 tablet, Refills: 0    polyethylene glycol  (MIRALAX / GLYCOLAX) packet Take 17 g by mouth daily as needed. Qty: 14 each, Refills: 0    sucralfate (CARAFATE) 1 GM/10ML suspension Take 10 mLs (1 g total) by mouth 4 (four) times daily -  with meals and at bedtime. Qty: 420 mL, Refills: 0      CONTINUE these medications which have CHANGED   Details  furosemide (LASIX) 40 MG tablet Take 1 tablet (40 mg total) by mouth daily as needed for fluid. Alternates taking 40mg  for two days then takes 60mg  on the third day, then continue Qty: 30 tablet, Refills: 0      CONTINUE these medications which have NOT CHANGED   Details  fentaNYL (DURAGESIC - DOSED MCG/HR) 12 MCG/HR APP 1 PATCH TO THE SKIN Q 72 HOURS Refills: 0    gabapentin (NEURONTIN) 300 MG capsule Take 300 mg by mouth 3 (three) times daily.    metoprolol tartrate (LOPRESSOR) 25 MG tablet Take 25 mg by mouth 3 (three) times daily.    nitroGLYCERIN (NITROSTAT) 0.4 MG SL tablet Place 0.4 mg under the tongue every 5 (five) minutes as needed for chest pain.  STOP taking these medications     aspirin EC 81 MG tablet      atorvastatin (LIPITOR) 80 MG tablet      B Complex-Biotin-FA (B-COMPLEX PO)      clopidogrel (PLAVIX) 75 MG tablet      ferrous sulfate 325 (65 FE) MG tablet      guaiFENesin (MUCINEX) 600 MG 12 hr tablet      lidocaine (XYLOCAINE) 5 % ointment      lisinopril (PRINIVIL,ZESTRIL) 20 MG tablet      potassium chloride (K-DUR,KLOR-CON) 10 MEQ tablet        Allergies  Allergen Reactions  . Motrin [Ibuprofen] Nausea And Vomiting      The results of significant diagnostics from this hospitalization (including imaging, microbiology, ancillary and laboratory) are listed below for reference.    Significant Diagnostic Studies: Ct Abdomen Pelvis Wo Contrast  04/05/2015  CLINICAL DATA:  Evaluate gastric mass. Patient refused to drink oral contrast. EXAM: CT ABDOMEN AND PELVIS WITHOUT CONTRAST TECHNIQUE: Multidetector CT imaging of the abdomen and  pelvis was performed following the standard protocol without IV contrast. COMPARISON:  None. FINDINGS: Multiple heterogeneous low-density masses are identified in the liver, largest in the right lobe liver measuring 3.7 x 5.4 cm. There is ascites in the abdomen and pelvis. There are extensive soft tissue masses throughout the mesenteries throughout the abdomen and pelvis. There is generalized bowel wall thickening throughout the stomach with eccentric bowel wall thickening more prominently involving the gastric fundus and proximal body measuring at least 2.9 x 5.9 cm. Small focal nodules are identified in bilateral adrenal glands. The right adrenal gland nodule measures 0.8 cm. The left adrenal gland nodule measures 1.3 cm. There is probable lymphadenopathy along the porta hepatis region. There is no retroperitoneal lymphadenopathy. There is a small calcified granuloma within the spleen. The spleen is otherwise normal. The pancreas is normal. The patient is status post prior cholecystectomy. There is atherosclerosis of the abdominal aorta without aneurysmal dilatation. Images of the pelvis demonstrate heterogeneous mass in the left adnexa measuring 3.9 x 4.6 cm. There is a question mass in the right adnexa with internal calcifications. The uterus is not seen. The bladder is normal. Evaluation of the inferior pelvis is limited due to metallic artifact forearm prior bilateral hip surgeries with hardware. There is a 6 mm focal lytic lucency with cortical destruction of the left ilium best seen on image 58. There is a small right pleural effusion. There is a 5 mm nodule in the anterior right lower lobe on image 5, series 5. IMPRESSION: Findings consistent with extensive liver, mesenteric metastasis. Findings are suspicious for metastasis in the adrenal glands, left ilium, and possible lymphadenopathy in the portal hepatis region. There is generalized bowel wall thickening throughout the stomach with eccentric bowel wall  thickening more prominently involving the gastric fundus and proximal body measuring at least 2.9 x 5.9 cm. There is heterogeneous mass of the left adnexa and possible mass of the right adnexa. Possible primary include gastric carcinoma or carcinoma of ovarian origin. Electronically Signed   By: Abelardo Diesel M.D.   On: 04/05/2015 17:12   Dg Chest 2 View  04/05/2015  CLINICAL DATA:  Weakness and shortness of breath. History of gastric tumor in pneumonia. EXAM: CHEST  2 VIEW COMPARISON:  Concurrent CT abdomen and pelvis. FINDINGS: The patient is kyphotic in position. There is a superior endplate compression deformity of the T12 vertebral body. There are no priors for comparison to assess for  chronicity. A hazy opacity of the medial right lung base corresponds to pleural fluid seen medially at the right lung base on the concurrent abdomen pelvis CT. No evidence of pneumonia or pulmonary edema. IMPRESSION: 1. Cardiomegaly. 2. Small amount of pleural fluid at the medial right lung base, when correlated with the concurrent CT abdomen and pelvis. 3. Superior endplate compression fracture of the T12 vertebral body. Electronically Signed   By: Curlene Dolphin M.D.   On: 04/05/2015 16:58    Microbiology: Recent Results (from the past 240 hour(s))  MRSA PCR Screening     Status: None   Collection Time: 04/03/15 11:57 PM  Result Value Ref Range Status   MRSA by PCR NEGATIVE NEGATIVE Final    Comment:        The GeneXpert MRSA Assay (FDA approved for NASAL specimens only), is one component of a comprehensive MRSA colonization surveillance program. It is not intended to diagnose MRSA infection nor to guide or monitor treatment for MRSA infections.      Labs: Basic Metabolic Panel:  Recent Labs Lab 04/04/15 1009 04/05/15 0523 04/06/15 0425 04/07/15 0643  NA 141 142 143 144  K 4.0 3.9 3.5 3.3*  CL 112* 113* 113* 114*  CO2 20* 21* 20* 22  GLUCOSE 150* 113* 100* 101*  BUN 50* 51* 51* 42*   CREATININE 2.01* 1.71* 1.54* 1.20*  CALCIUM 8.8* 9.0 8.7* 8.7*   Liver Function Tests:  Recent Labs Lab 04/04/15 1009 04/05/15 0523  AST 96* 77*  ALT 91* 88*  ALKPHOS 97 85  BILITOT 1.4* 0.8  PROT 5.6* 5.2*  ALBUMIN 2.6* 2.4*   CBC:  Recent Labs Lab 04/04/15 0128 04/04/15 1007 04/05/15 0523 04/06/15 0425 04/07/15 0643  WBC  --  16.9* 19.6* 15.5* 20.1*  HGB 7.4* 10.9* 10.8* 11.1* 12.3  HCT 24.1* 34.7* 34.5* 36.5 40.2  MCV  --  76.4* 75.7* 76.7* 76.1*  PLT  --  307 281 301 285      Signed: Jehanzeb Memon. MD [facility] 04/07/2015, 10:19 AM   By signing my name below, I, Rosalie Doctor, attest that this documentation has been prepared under the direction and in the presence of [facility]. MD Electronically Signed: Rosalie Doctor, Scribe. 04/07/2015 9:54am  I, Dr. Kathie Dike, personally performed the services described in this documentaiton. All medical record entries made by the scribe were at my direction and in my presence. I have reviewed the chart and agree that the record reflects my personal performance and is accurate and complete  Kathie Dike, MD, 04/07/2015 10:19 AM

## 2015-04-07 NOTE — Care Management Note (Signed)
Case Management Note  Patient Details  Name: Mercedes Garcia MRN: [id number] Date of Birth: 1926/07/24  Subjective/Objective:                    Action/Plan:   Expected Discharge Date:  04/07/15               Expected Discharge Plan:  Home w Hospice Care  In-House Referral:  NA  Discharge planning Services  CM Consult  Post Acute Care Choice:  Hospice Choice offered to:  Adult Children  DME Arranged:    DME Agency:     HH Arranged:    HH Agency:     Status of Service:  Completed, signed off  Medicare Important Message Given:  Yes Date Medicare IM Given:    Medicare IM give by:    Date Additional Medicare IM Given:    Additional Medicare Important Message give by:     If discussed at [place] of Stay Meetings, dates discussed:    Additional Comments: Pt discharged home today with [facility] (per pts daughter choice). Referral called and faxed to [facility] with [facility]. [facility] to contact pts PCP to sign orders. Family would like hospital bed arranged and [facility] will arrange the bed. Pt discharged home today. Pts daughter and pts nurse aware of discharge arrangements. [name], RN 04/07/2015, 10:28 AM

## 2015-04-07 NOTE — Care Management Important Message (Signed)
Important Message  Patient Details  Name: Shayana Colo MRN: [id number] Date of Birth: 1927/04/29   Medicare Important Message Given:  Yes    Joylene Draft, RN 04/07/2015, 10:27 AM

## 2015-05-08 DEATH — deceased

## 2016-09-22 IMAGING — DX DG CHEST 2V
2 series · 2 of 2 positions shown · non-contrast
Comparison: Concurrent CT abdomen and pelvis.

CLINICAL DATA: Weakness and shortness of breath. History of gastric
tumor in pneumonia.

EXAM:
CHEST  2 VIEW

[chest lat]
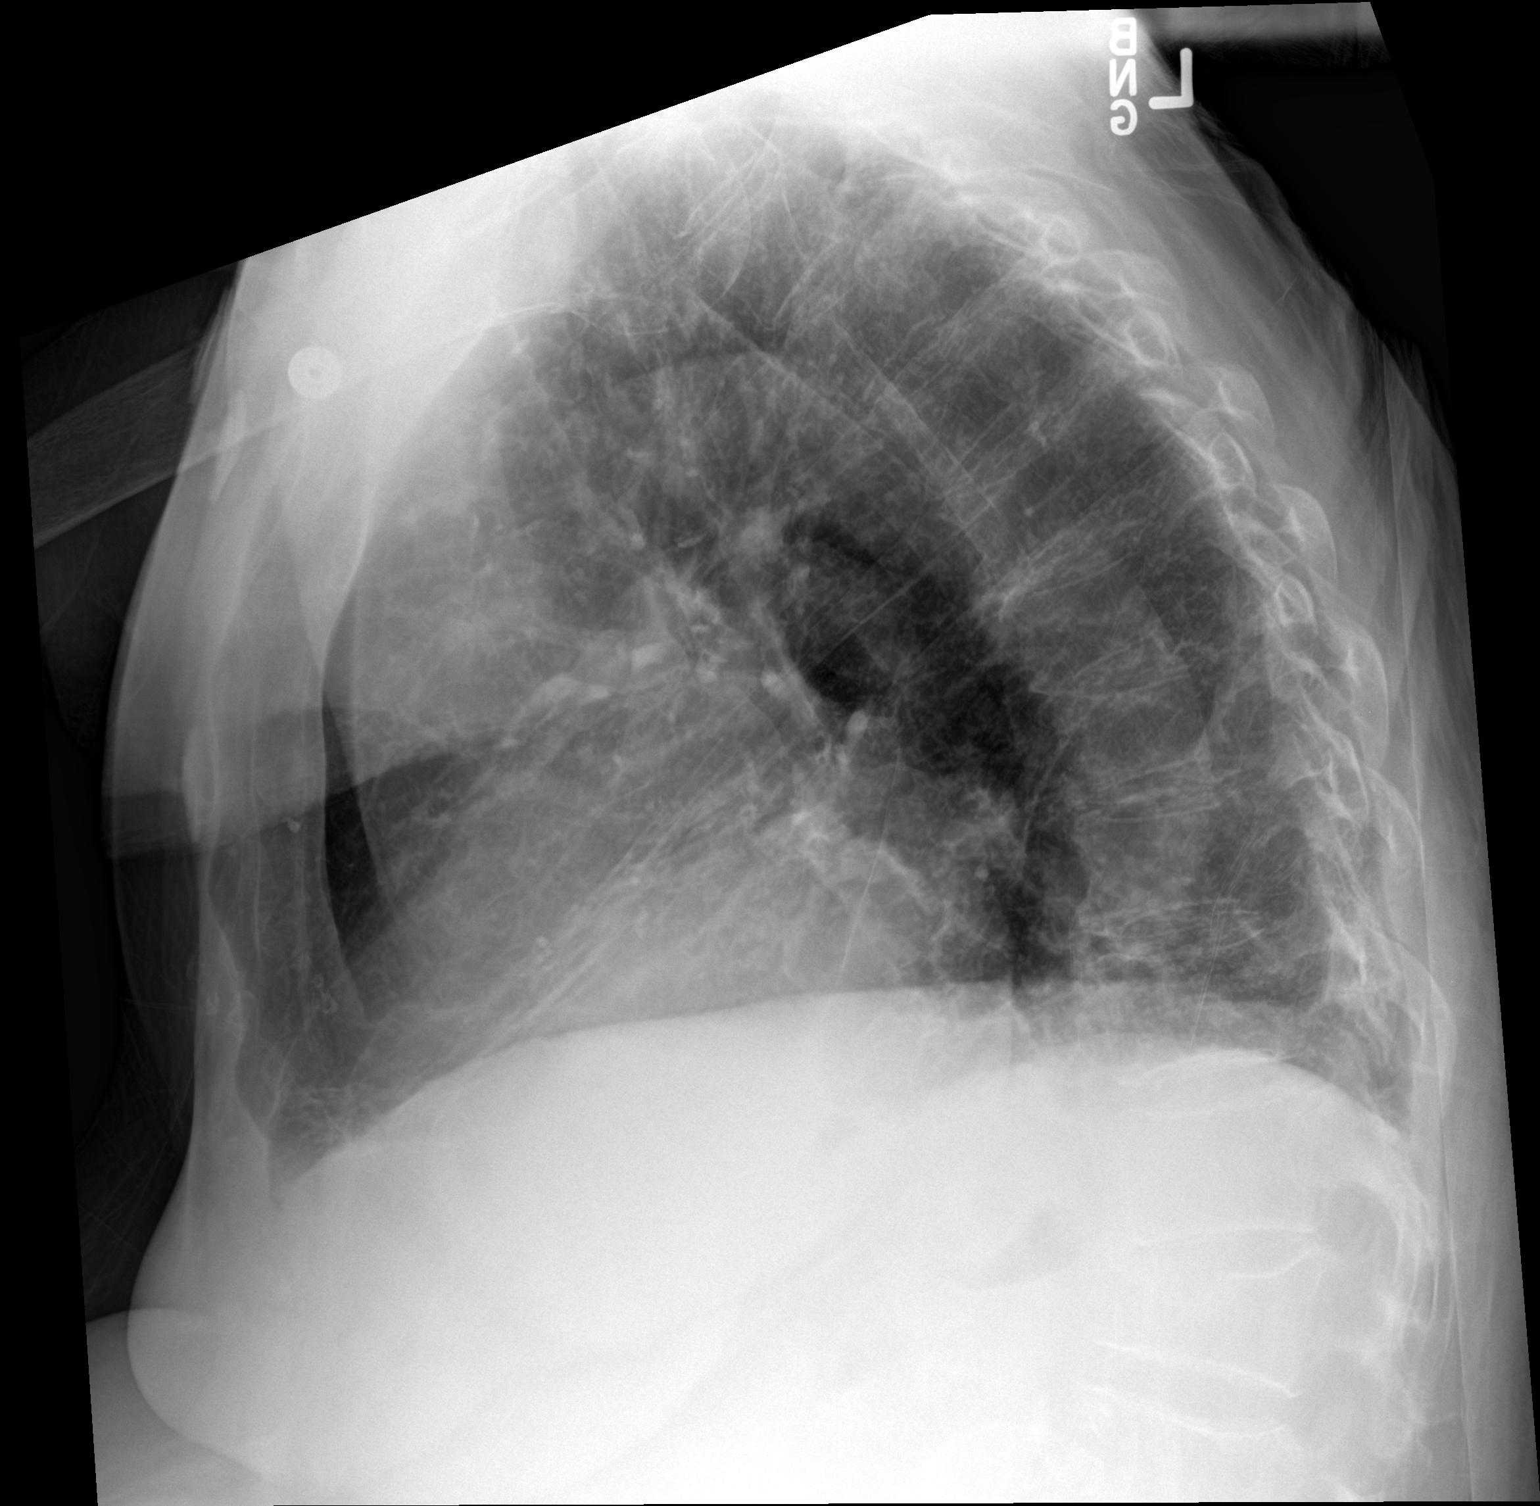

[chest ap]
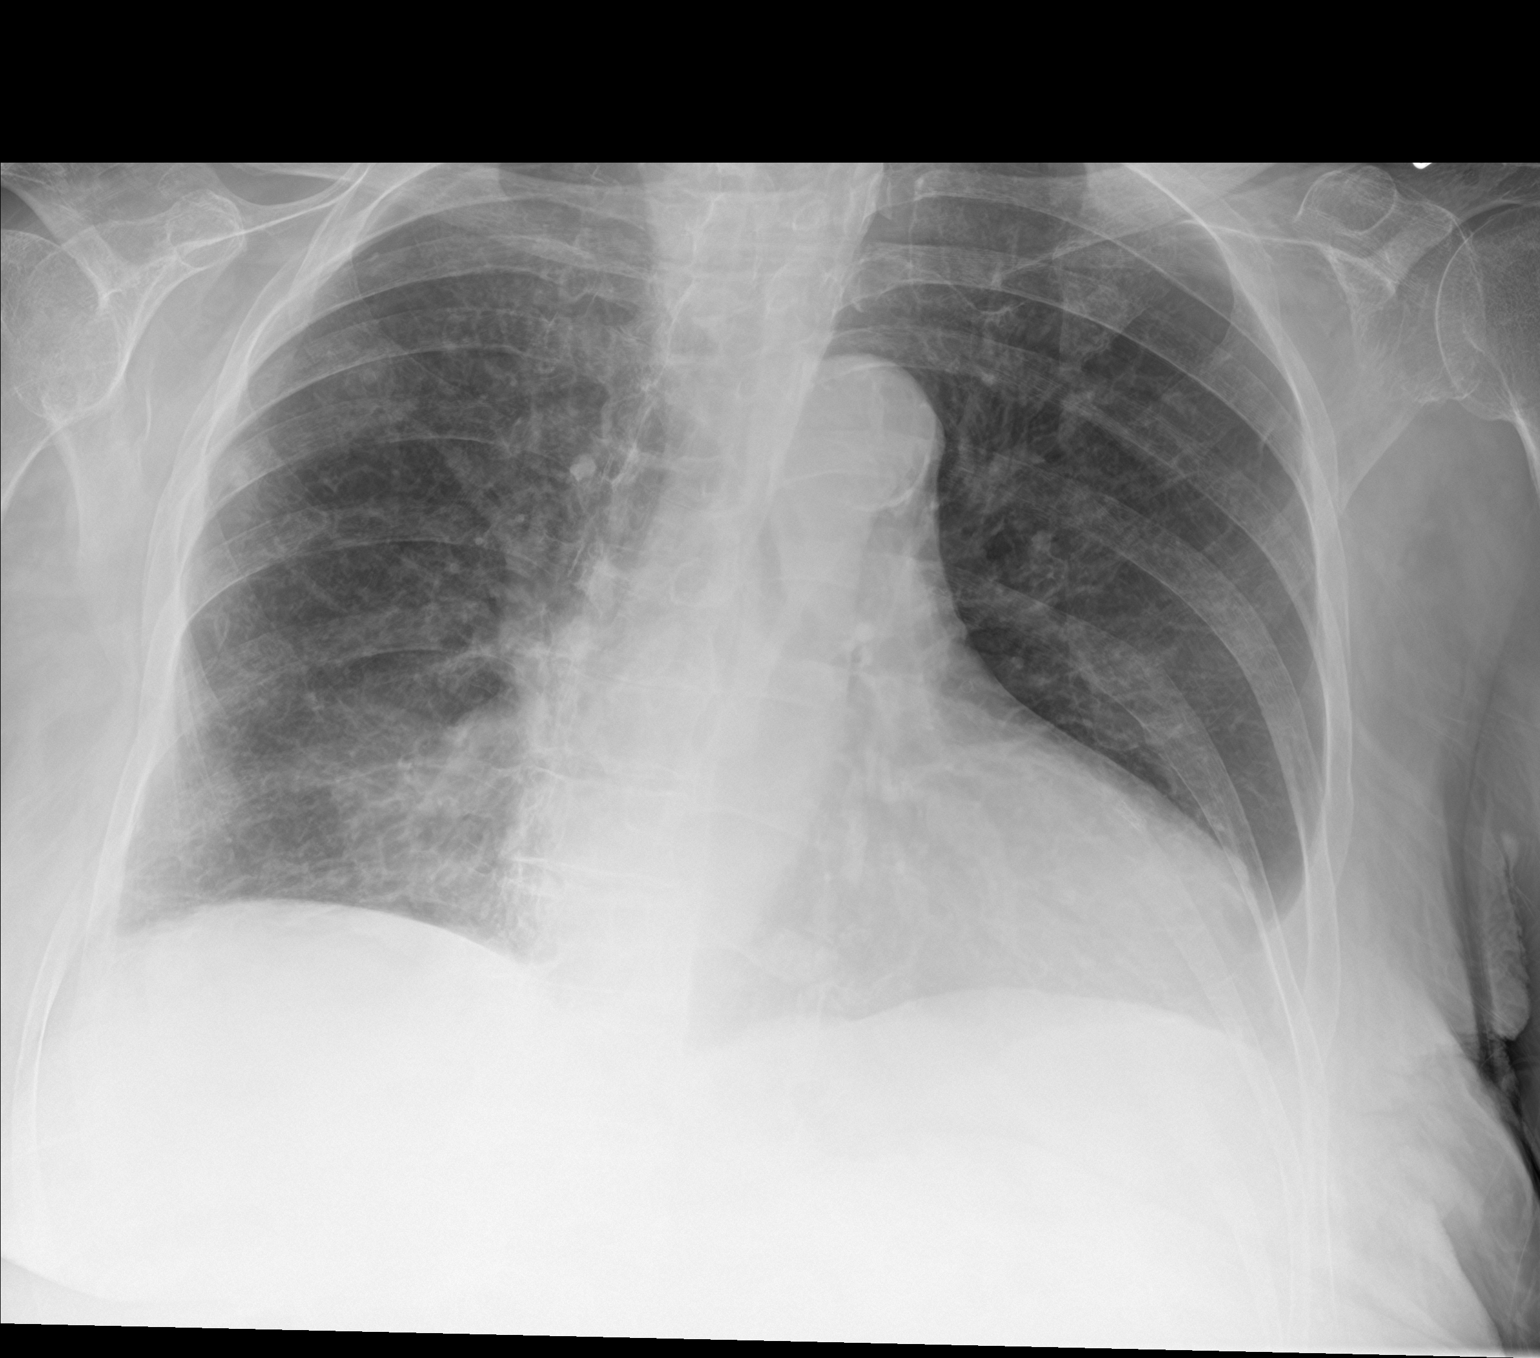

[2 of 2 positions shown; findings below may reference images not displayed]

FINDINGS: The patient is kyphotic in position. There is a superior endplate
compression deformity of the T12 vertebral body. There are no priors
for comparison to assess for chronicity.

A hazy opacity of the medial right lung base corresponds to pleural
fluid seen medially at the right lung base on the concurrent abdomen
pelvis CT. No evidence of pneumonia or pulmonary edema.
IMPRESSION: 1. Cardiomegaly.
2. Small amount of pleural fluid at the medial right lung base, when
correlated with the concurrent CT abdomen and pelvis.
3. Superior endplate compression fracture of the T12 vertebral body.

## 2016-09-22 IMAGING — CT CT ABD-PELV W/O CM
2 of 4 series · 16 of 46 positions shown, 18 images · non-contrast
Comparison: None.

CLINICAL DATA: Evaluate gastric mass. Patient refused to drink oral
contrast.

EXAM:
CT ABDOMEN AND PELVIS WITHOUT CONTRAST
TECHNIQUE: Multidetector CT imaging of the abdomen and pelvis was performed
following the standard protocol without IV contrast.

[Series 2: abdomen/pelvis w/o contrast · axial · non-contrast · 0.71mm/px · z∈[-128,+238]mm · 13 of 85 slices shown, 15 images]
[im 6/85  soft-tissue]
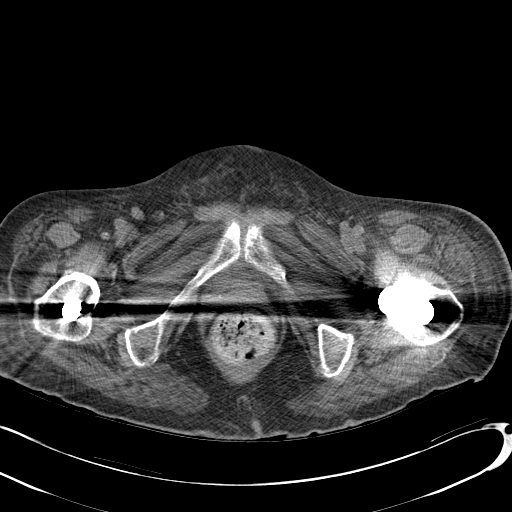
[im 6/85  bone]
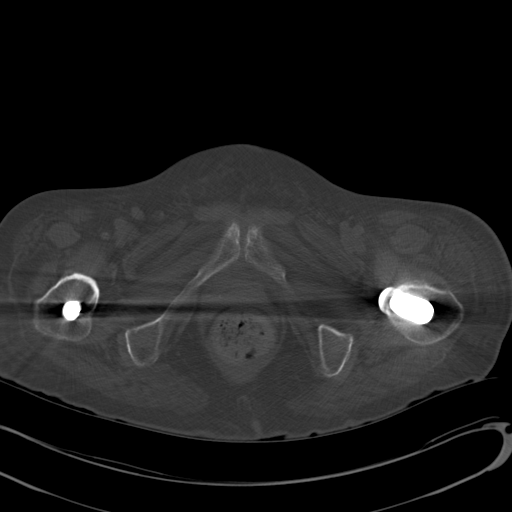
[im 12/85  soft-tissue]
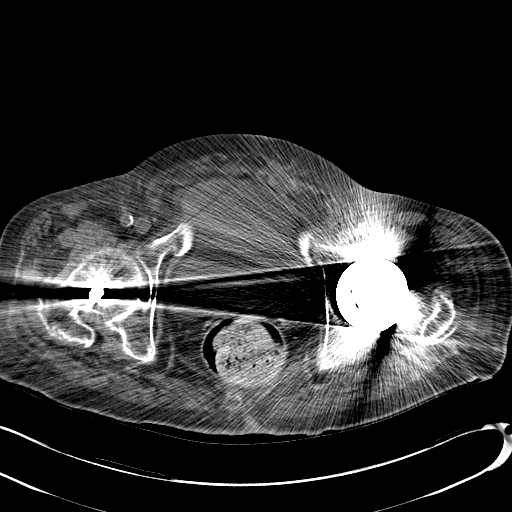
[im 17/85  soft-tissue]
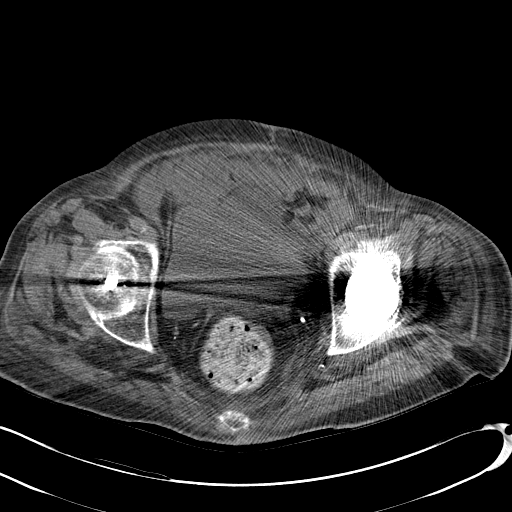
[im 23/85  soft-tissue]
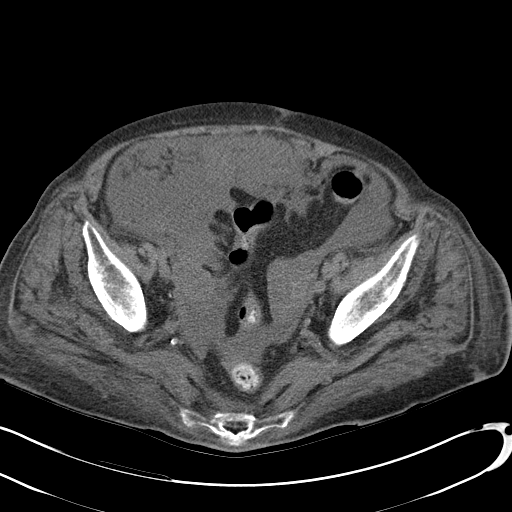
[im 29/85  soft-tissue]
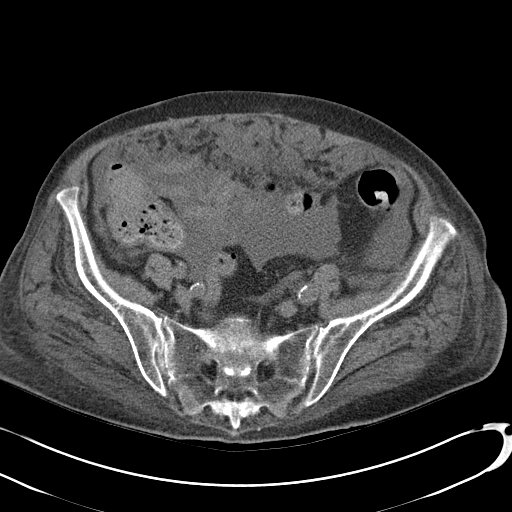
[im 34/85  soft-tissue]
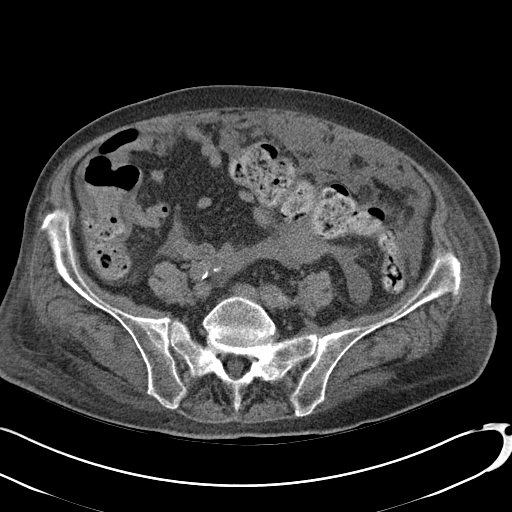
[im 45/85  soft-tissue]
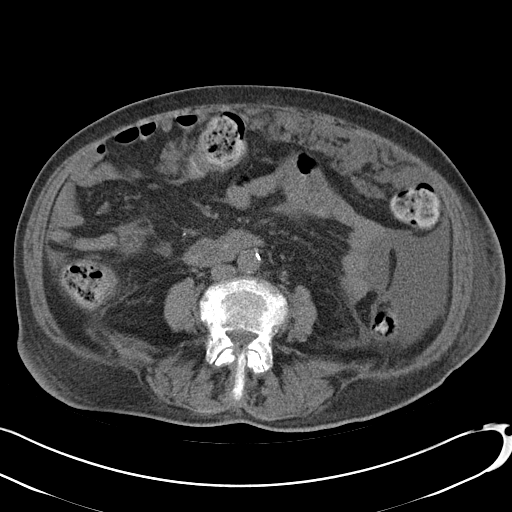
[im 51/85  soft-tissue]
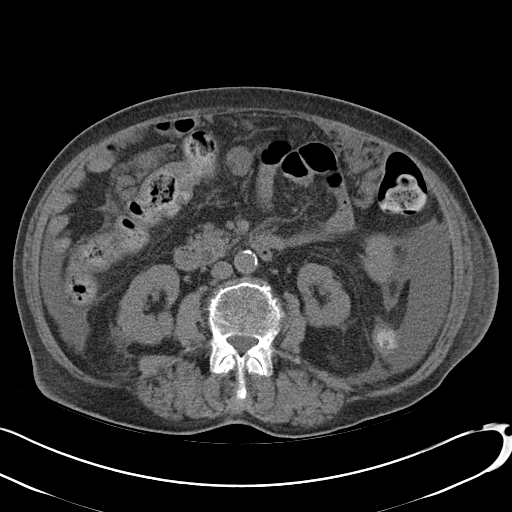
[im 57/85  soft-tissue]
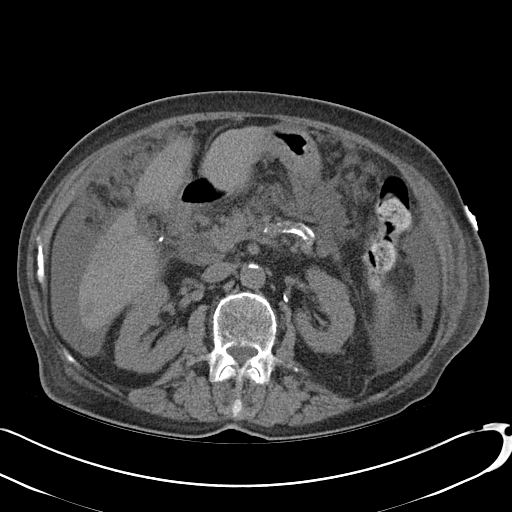
[im 57/85  bone]
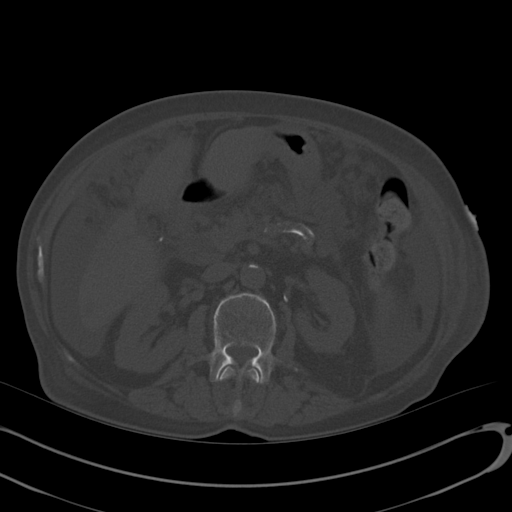
[im 62/85  soft-tissue]
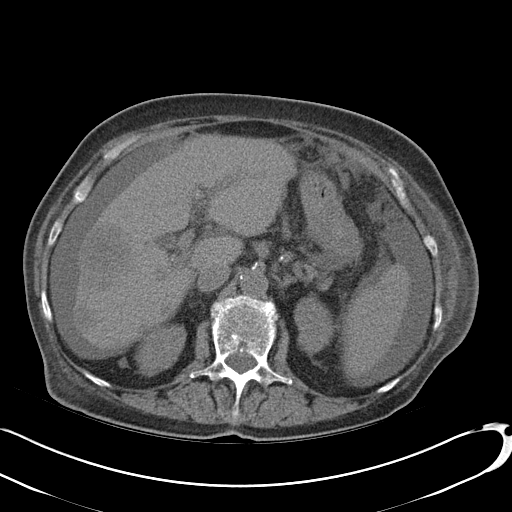
[im 68/85  soft-tissue]
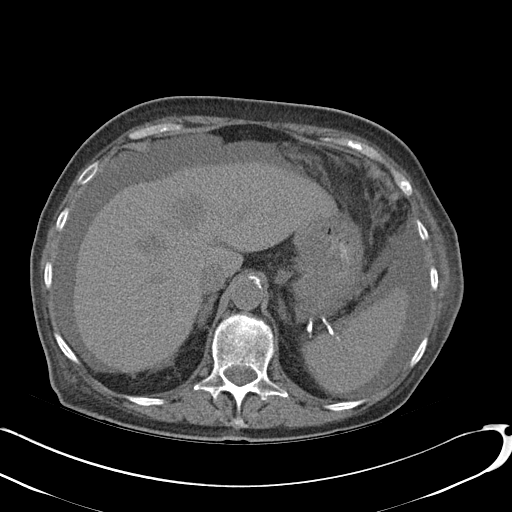
[im 73/85  soft-tissue]
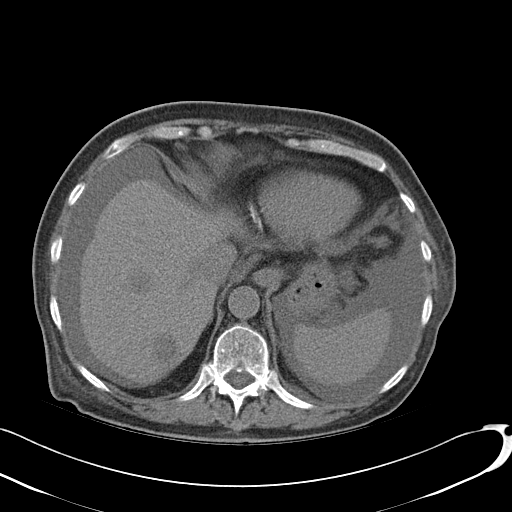
[im 79/85  soft-tissue]
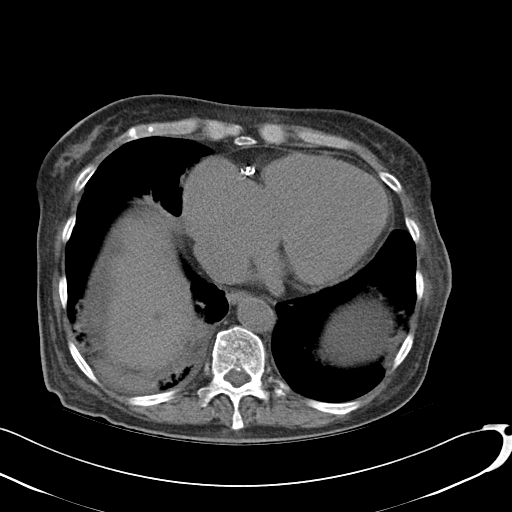

[Series 3: mpr cor (id) · coronal · 0.75mm/px · 3 of 82 slices shown]
[im 28/82  soft-tissue]
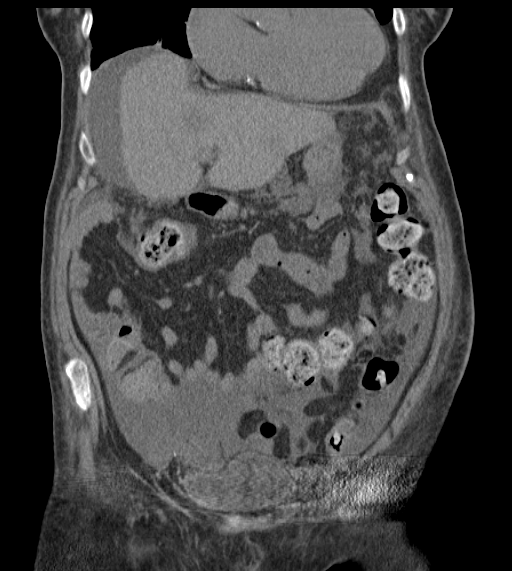
[im 37/82  soft-tissue]
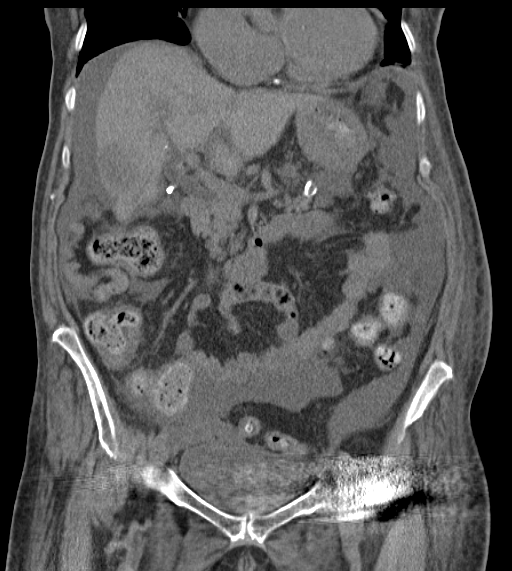
[im 46/82  soft-tissue]
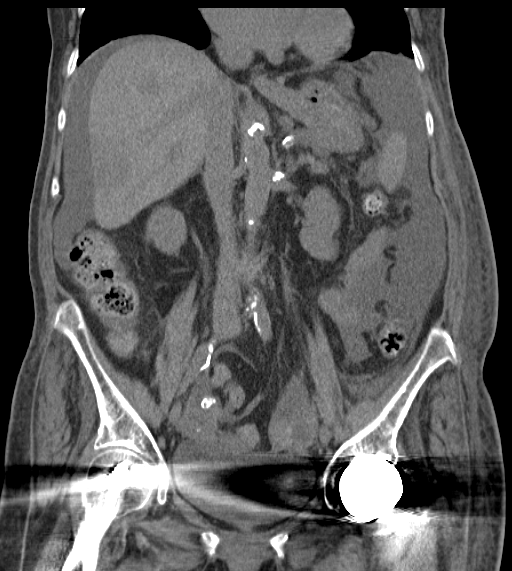

[16 of 46 positions shown; findings below may reference images not displayed]

FINDINGS: Multiple heterogeneous low-density masses are identified in the
liver, largest in the right lobe liver measuring 3.7 x 5.4 cm. There
is ascites in the abdomen and pelvis. There are extensive soft
tissue masses throughout the mesenteries throughout the abdomen and
pelvis.

There is generalized bowel wall thickening throughout the stomach
with eccentric bowel wall thickening more prominently involving the
gastric fundus and proximal body measuring at least 2.9 x 5.9 cm.

Small focal nodules are identified in bilateral adrenal glands. The
right adrenal gland nodule measures 0.8 cm. The left adrenal gland
nodule measures 1.3 cm.

There is probable lymphadenopathy along the porta hepatis region.
There is no retroperitoneal lymphadenopathy.

There is a small calcified granuloma within the spleen. The spleen
is otherwise normal. The pancreas is normal. The patient is status
post prior cholecystectomy. There is atherosclerosis of the
abdominal aorta without aneurysmal dilatation.

Images of the pelvis demonstrate heterogeneous mass in the left
adnexa measuring 3.9 x 4.6 cm. There is a question mass in the right
adnexa with internal calcifications. The uterus is not seen.

The bladder is normal. Evaluation of the inferior pelvis is limited
due to metallic artifact forearm prior bilateral hip surgeries with
hardware.

There is a 6 mm focal lytic lucency with cortical destruction of the
left ilium best seen on image 58.

There is a small right pleural effusion. There is a 5 mm nodule in
the anterior right lower lobe on image 5, series 5.
IMPRESSION: Findings consistent with extensive liver, mesenteric metastasis.
Findings are suspicious for metastasis in the adrenal glands, left
ilium, and possible lymphadenopathy in the portal hepatis region.
There is generalized bowel wall thickening throughout the stomach
with eccentric bowel wall thickening more prominently involving the
gastric fundus and proximal body measuring at least 2.9 x 5.9 cm.
There is heterogeneous mass of the left adnexa and possible mass of
the right adnexa. Possible primary include gastric carcinoma or
carcinoma of ovarian origin.
